# Patient Record
Sex: Female | Born: 1950
Health system: Southern US, Community
[De-identification: ages and names within clinical notes are randomized; demographics above are authoritative.]

## PROBLEM LIST (undated history)

## (undated) DIAGNOSIS — R5382 Chronic fatigue, unspecified: Secondary | ICD-10-CM

## (undated) DIAGNOSIS — I10 Essential (primary) hypertension: Secondary | ICD-10-CM

## (undated) DIAGNOSIS — M3500 Sicca syndrome, unspecified: Secondary | ICD-10-CM

## (undated) DIAGNOSIS — K219 Gastro-esophageal reflux disease without esophagitis: Secondary | ICD-10-CM

## (undated) DIAGNOSIS — A048 Other specified bacterial intestinal infections: Secondary | ICD-10-CM

## (undated) DIAGNOSIS — M6281 Muscle weakness (generalized): Secondary | ICD-10-CM

## (undated) HISTORY — DX: Sjogren syndrome, unspecified: M35.00

## (undated) HISTORY — DX: Other specified bacterial intestinal infections: A04.8

## (undated) HISTORY — DX: Muscle weakness (generalized): M62.81

## (undated) HISTORY — PX: TOE SURGERY: SHX1073

## (undated) HISTORY — DX: Essential (primary) hypertension: I10

## (undated) HISTORY — DX: Chronic fatigue, unspecified: R53.82

---

## 2001-03-13 ENCOUNTER — Encounter: Payer: Self-pay | Admitting: Internal Medicine

## 2001-03-13 ENCOUNTER — Ambulatory Visit (HOSPITAL_COMMUNITY): Admission: RE | Admit: 2001-03-13 | Discharge: 2001-03-13 | Payer: Self-pay | Admitting: Internal Medicine

## 2001-03-27 ENCOUNTER — Encounter: Payer: Self-pay | Admitting: Internal Medicine

## 2001-03-27 ENCOUNTER — Ambulatory Visit (HOSPITAL_COMMUNITY): Admission: RE | Admit: 2001-03-27 | Discharge: 2001-03-27 | Payer: Self-pay | Admitting: Internal Medicine

## 2001-04-11 ENCOUNTER — Ambulatory Visit (HOSPITAL_COMMUNITY): Admission: RE | Admit: 2001-04-11 | Discharge: 2001-04-11 | Payer: Self-pay | Admitting: Internal Medicine

## 2001-04-11 ENCOUNTER — Encounter: Payer: Self-pay | Admitting: Internal Medicine

## 2002-05-09 ENCOUNTER — Encounter (HOSPITAL_COMMUNITY): Admission: RE | Admit: 2002-05-09 | Discharge: 2002-06-08 | Payer: Self-pay | Admitting: Internal Medicine

## 2002-05-12 ENCOUNTER — Encounter: Payer: Self-pay | Admitting: Internal Medicine

## 2003-08-10 ENCOUNTER — Ambulatory Visit (HOSPITAL_COMMUNITY): Admission: RE | Admit: 2003-08-10 | Discharge: 2003-08-10 | Payer: Self-pay | Admitting: Internal Medicine

## 2003-08-10 HISTORY — PX: COLONOSCOPY: SHX174

## 2003-08-10 HISTORY — PX: ESOPHAGOGASTRODUODENOSCOPY: SHX1529

## 2005-03-06 ENCOUNTER — Other Ambulatory Visit: Admission: RE | Admit: 2005-03-06 | Discharge: 2005-03-06 | Payer: Self-pay | Admitting: Obstetrics and Gynecology

## 2005-03-23 ENCOUNTER — Encounter: Admission: RE | Admit: 2005-03-23 | Discharge: 2005-03-23 | Payer: Self-pay | Admitting: Obstetrics and Gynecology

## 2005-04-13 ENCOUNTER — Encounter: Admission: RE | Admit: 2005-04-13 | Discharge: 2005-04-13 | Payer: Self-pay | Admitting: Obstetrics and Gynecology

## 2005-08-22 ENCOUNTER — Ambulatory Visit (HOSPITAL_COMMUNITY): Admission: RE | Admit: 2005-08-22 | Discharge: 2005-08-22 | Payer: Self-pay | Admitting: Internal Medicine

## 2005-08-22 IMAGING — US US PELVIS COMPLETE MODIFY
1 series · 13 of 25 positions shown · non-contrast
Comparison: none

CLINICAL DATA: Lower abdominal and pelvic pain.  
TRANSABDOMINAL AND TRANSVAGINAL PELVIC ULTRASOUND:
TECHNIQUE: Both transabdominal and transvaginal ultrasound examinations of the pelvis were performed including evaluation of the uterus, ovaries, adnexal regions, and pelvic cul-de-sac.

[Series 1: unknown · 0.32mm/px · 13 of 45 slices shown]
[im 1/45]
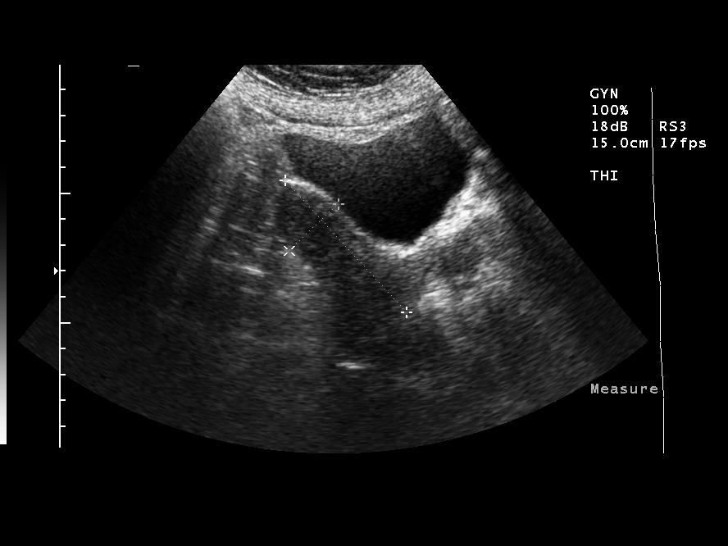
[im 4/45]
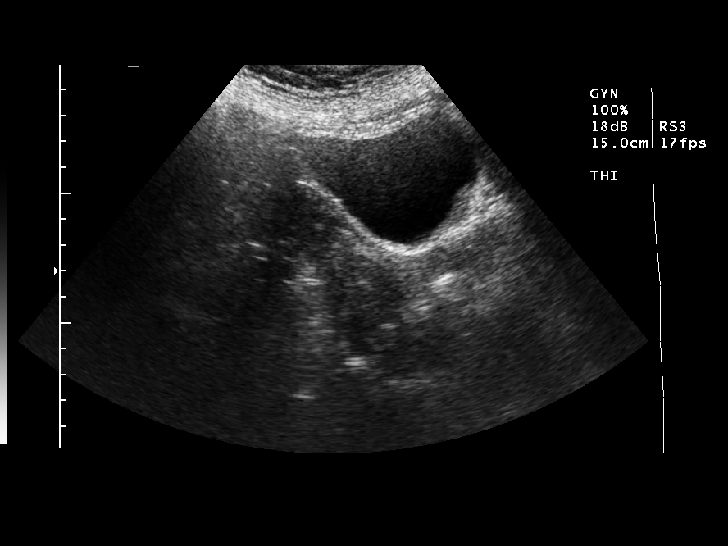
[im 8/45]
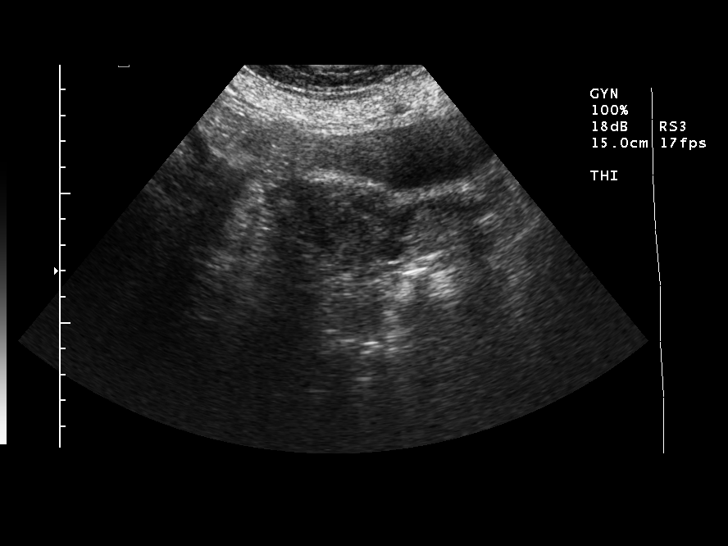
[im 12/45]
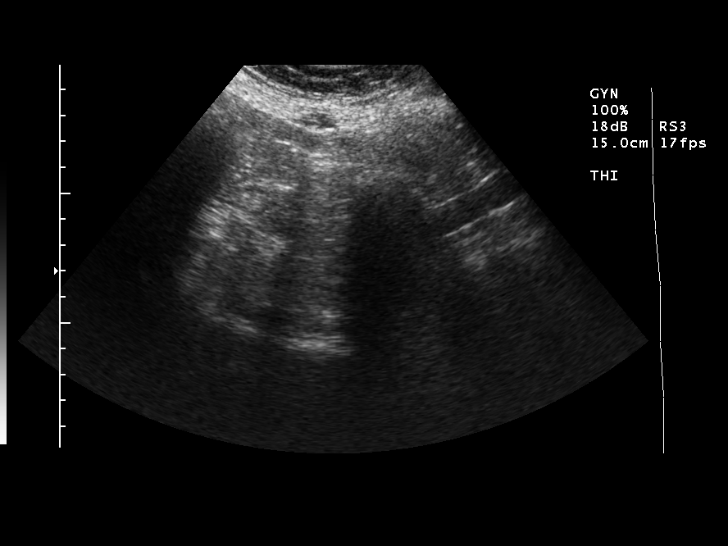
[im 15/45]
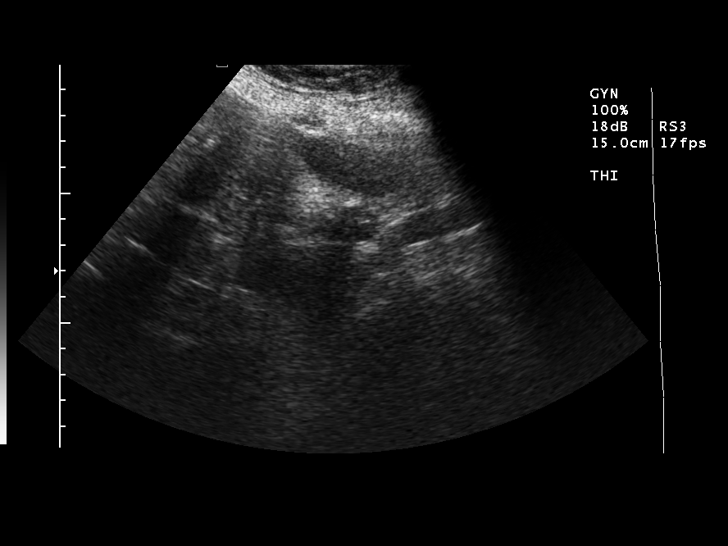
[im 19/45]
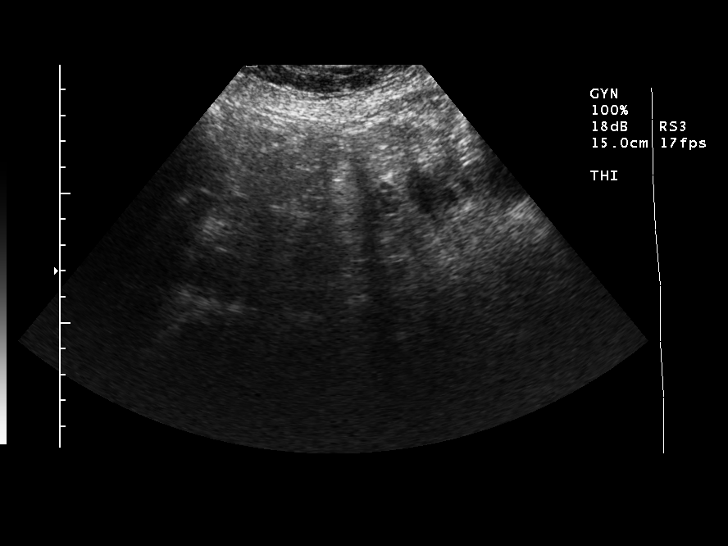
[im 23/45]
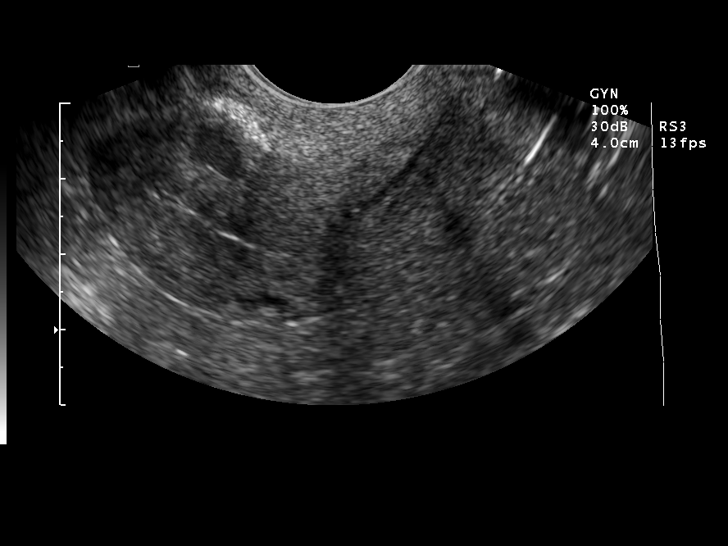
[im 26/45]
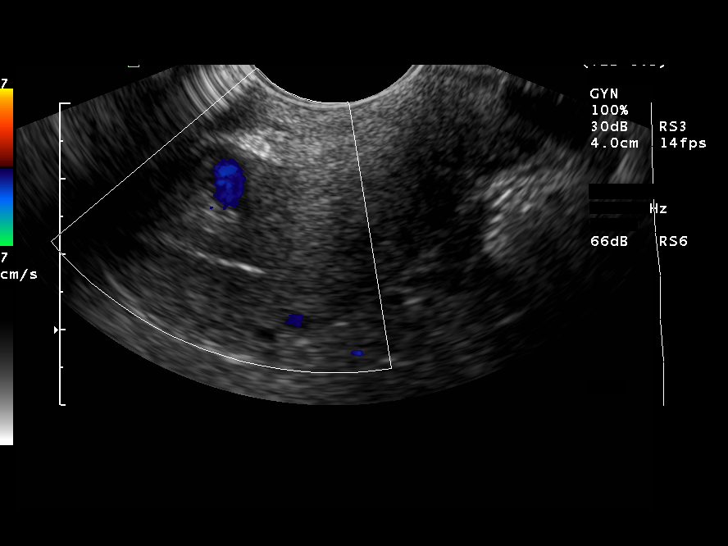
[im 30/45]
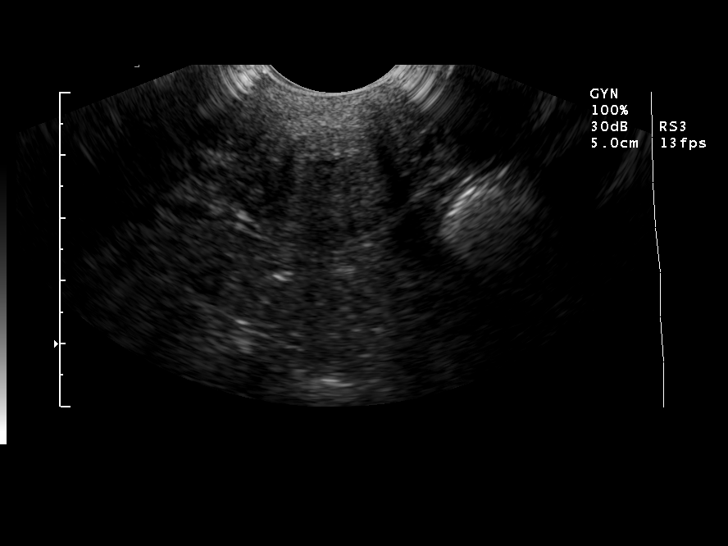
[im 34/45]
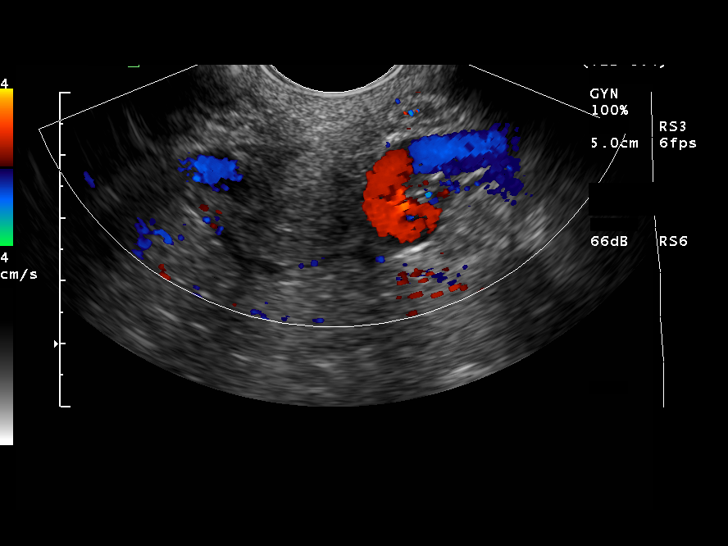
[im 37/45]
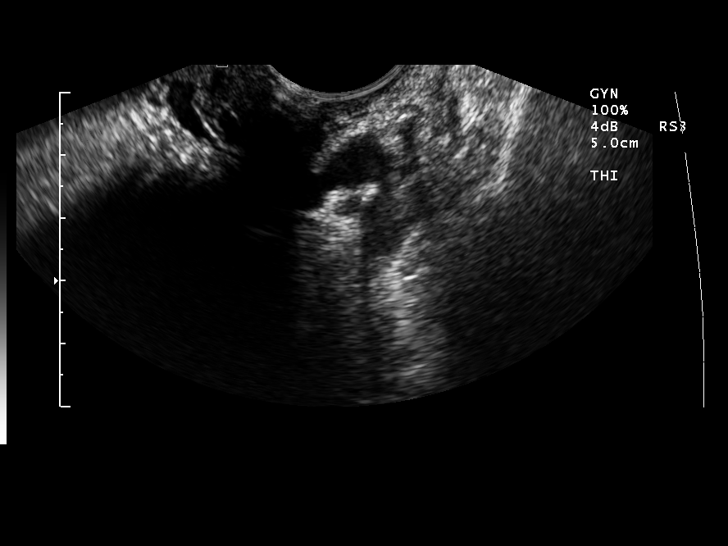
[im 41/45]
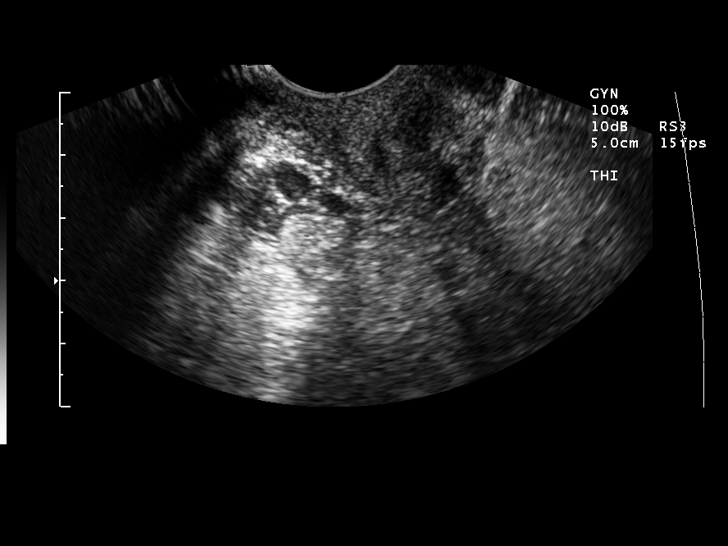
[im 45/45]
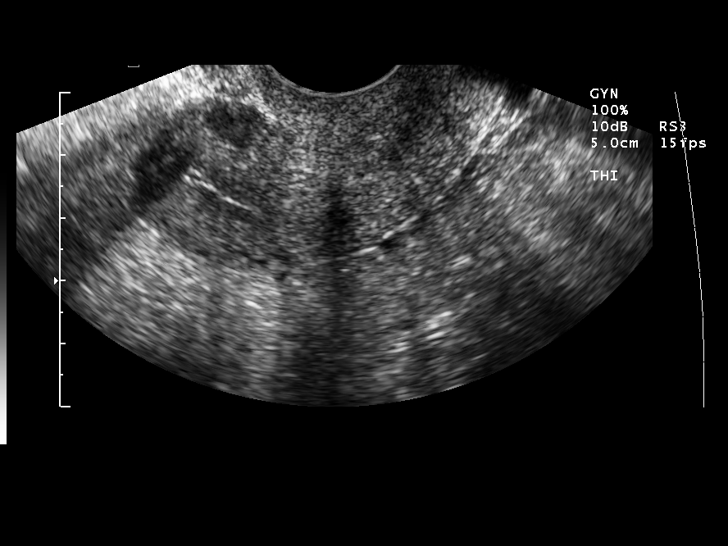

[13 of 25 positions shown; findings below may reference images not displayed]

FINDINGS: The uterus is normal in size measuring 7 cm in length.  No uterine fibroids or other masses are seen.  Prominent blood vessels are noted within the uterine myometrium and parametrial soft tissues, but no mass is identified.  Thin endometrium is seen measuring 3 mm in maximum double layer thickness on transvaginal sonography.  
Neither ovary is directly visualized on this study, however no adnexal masses are identified by either transabdominal or transvaginal sonography.  There is no evidence of free fluid.
IMPRESSION: 1.  Normal-sized uterus.  No evidence of mass or abnormal endometrial thickening.  
2.   Nonvisualization of ovaries.  No evidence of adnexal mass or free fluid identified. 
3.  Prominent vessels noted within the uterine myometrium and the parametrial soft tissues.  This is nonspecific, but may be seen with pelvic congestion syndrome; clinical correlation is recommended.

## 2005-08-22 IMAGING — CT CT ABDOMEN W/ CM
2 of 3 series · 13 of 32 positions shown, 19 images · IV contrast (CONTRAST)
Comparison: none

CLINICAL DATA: Abdominal pain.  Nausea.  Elevated liver function tests.
 ABDOMEN CT WITH CONTRAST:
TECHNIQUE: Multidetector CT imaging of the abdomen was performed following the standard protocol during bolus administration of intravenous contrast.
 Contrast:  100 cc Omnipaque 300 and oral contrast.

[Series 5971: — · axial · 0.66mm/px · z∈[+1536,+1742]mm · 11 of 51 slices shown, 17 images (1 of 2)]
[im 5/51  soft-tissue]
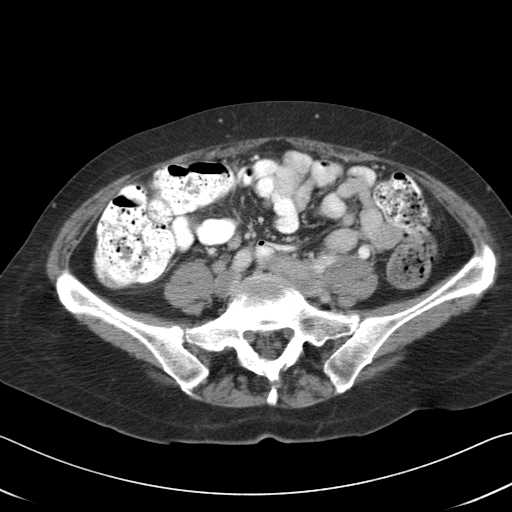
[im 5/51  bone]
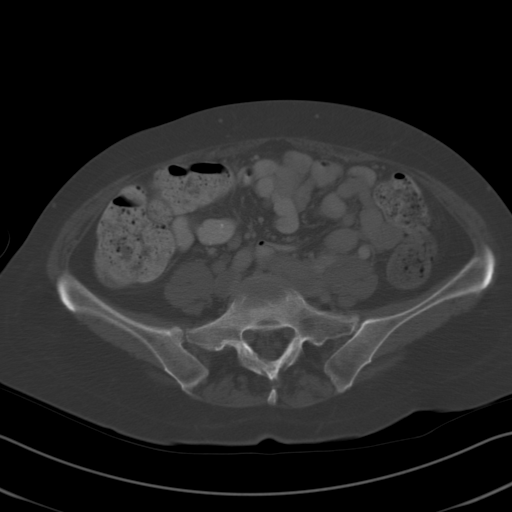
[im 9/51  soft-tissue]
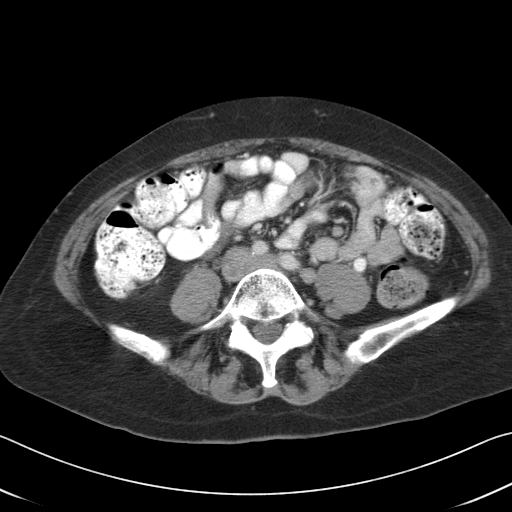
[im 13/51  soft-tissue]
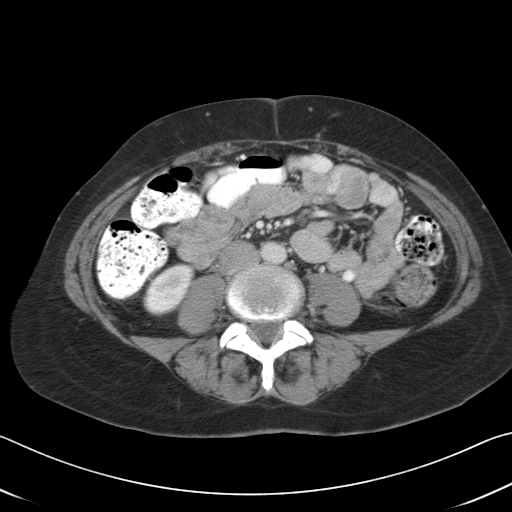
[im 17/51  soft-tissue]
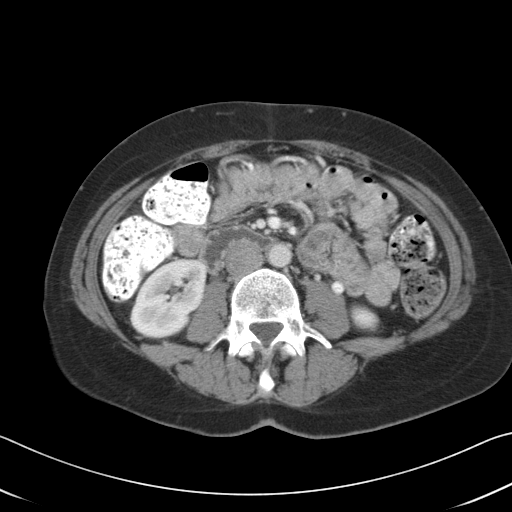
[im 21/51  soft-tissue]
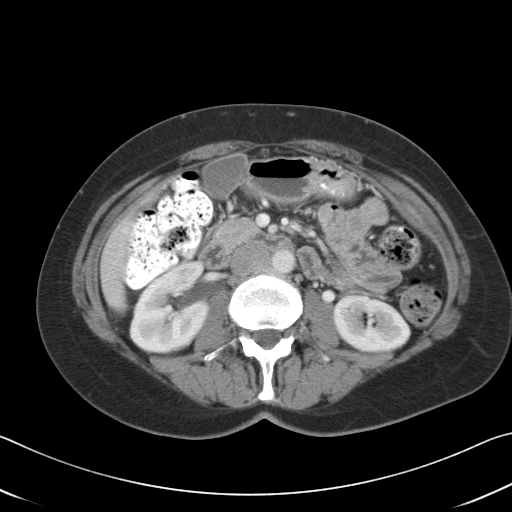
[im 26/51  soft-tissue]
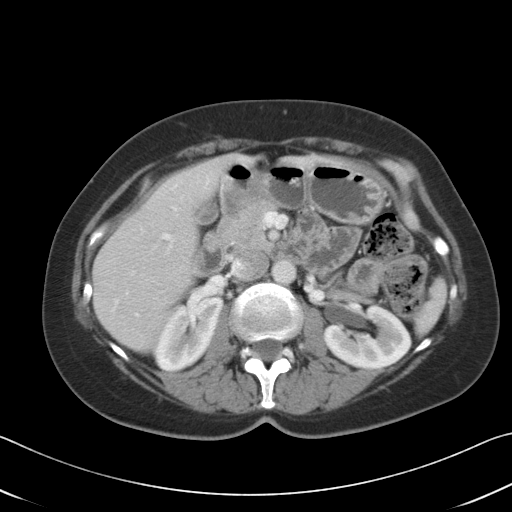
[im 30/51  soft-tissue]
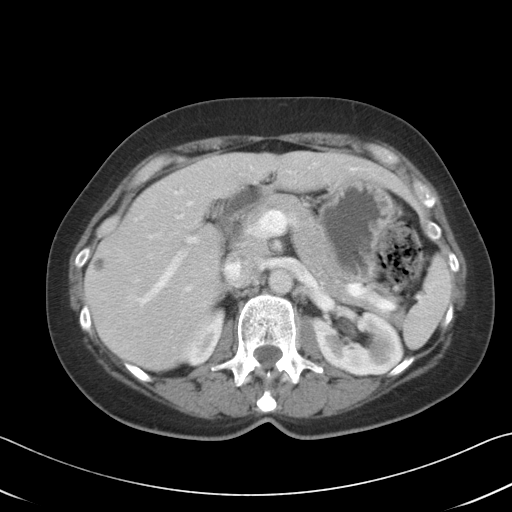
[im 34/51  soft-tissue]
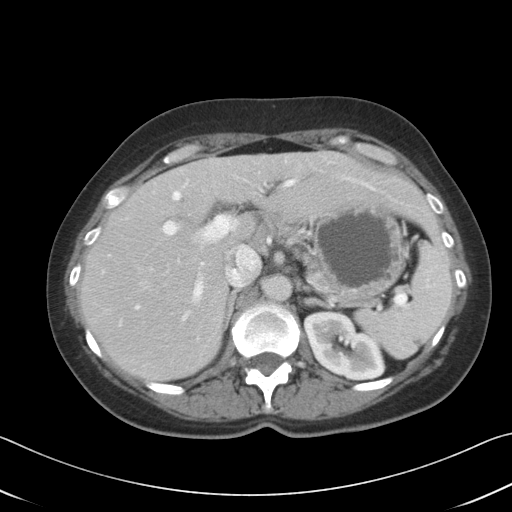
[im 34/51  lung]
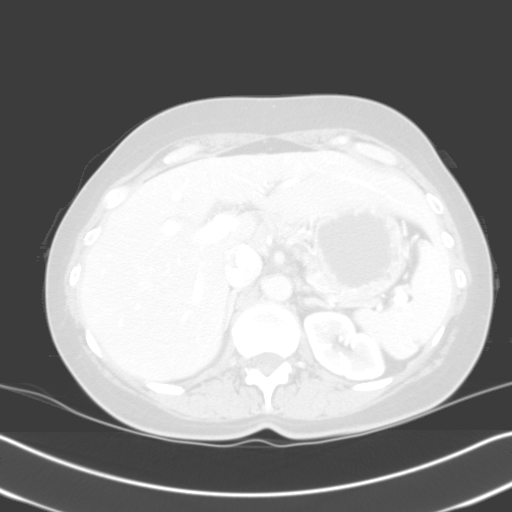
[im 38/51  soft-tissue]
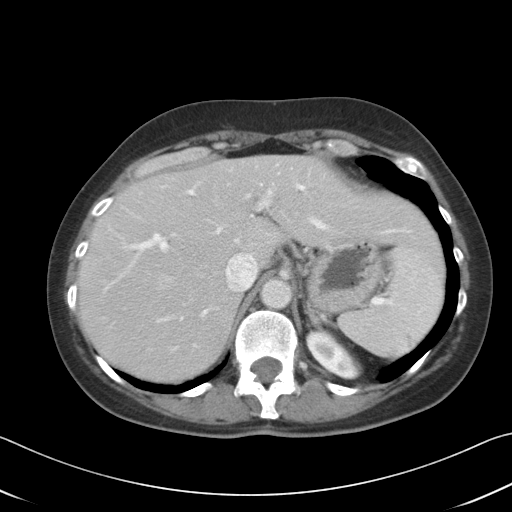
[im 38/51  lung]
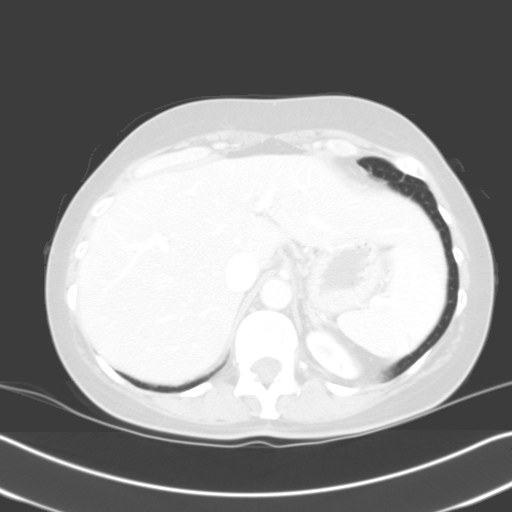
[im 38/51  bone]
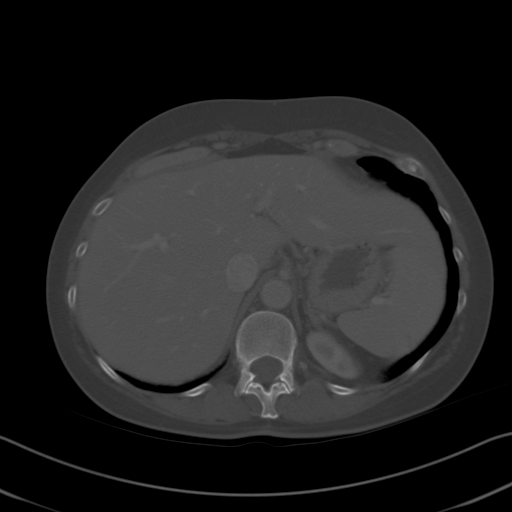
[im 42/51  soft-tissue]
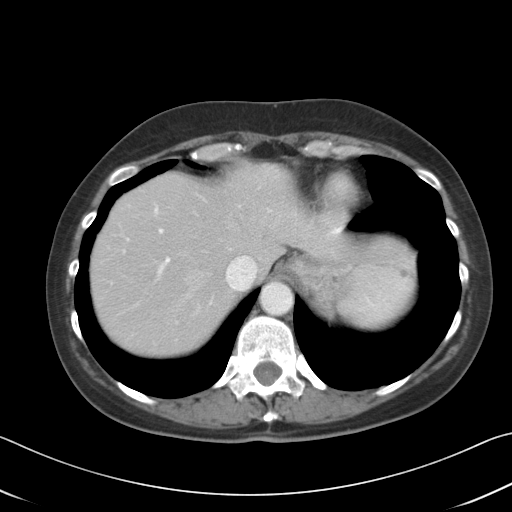
[im 42/51  lung]
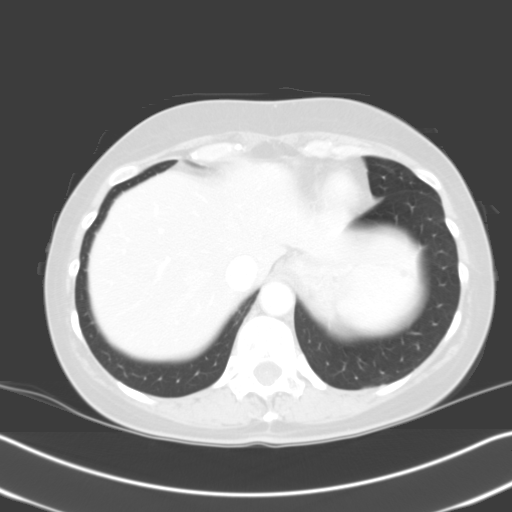
[im 46/51  soft-tissue]
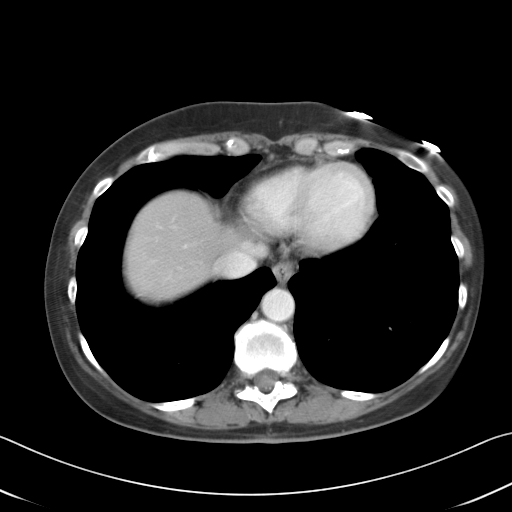
[im 46/51  lung]
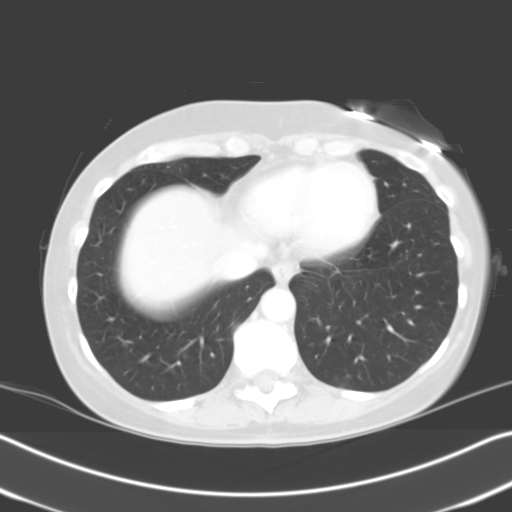

[Series 5973: — · axial · 0.66mm/px · z∈[+1666,+1692]mm · 2 of 25 slices shown (2 of 2)]
[im 5/25  soft-tissue]
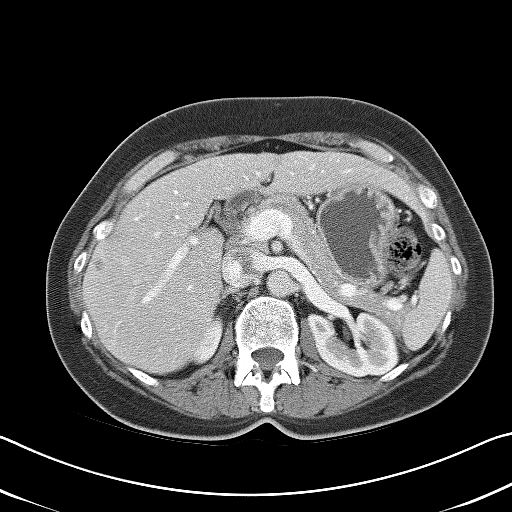
[im 10/25  soft-tissue]
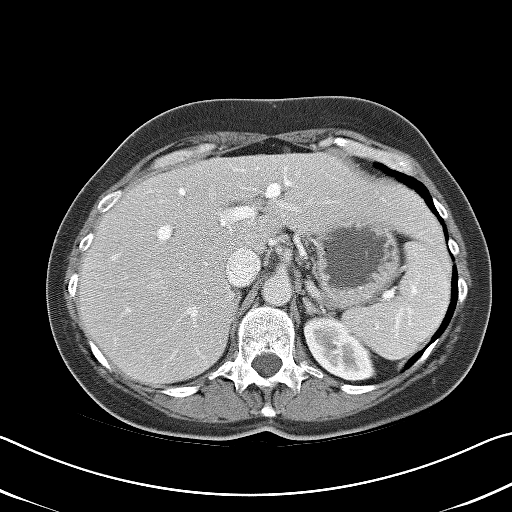

[13 of 32 positions shown; findings below may reference images not displayed]

FINDINGS: The visualized of the lung bases are clear.  Several tiny hepatic cysts are noted.  There is no evidence of liver masses or biliary dilatation.  The gallbladder is unremarkable.  The spleen, pancreas, adrenal glands, and kidneys are also normal on appearance.  There is no evidence of abnormal soft tissue mass within the abdomen.  There is no evidence of adenopathy.  There is no evidence of inflammatory process or abnormal fluid collection.  The visualized abdominal bowel loops are unremarkable.  There is no evidence of abdominal hernia or mass.
IMPRESSION: 1.  No acute findings or other significant abnormality.  
 2.  Tiny hepatic cysts incidentally noted.

## 2005-08-22 IMAGING — CR DG CHEST 2V
2 series · 2 of 2 positions shown · non-contrast
Comparison: none

HISTORY: Fatigue, cough

CHEST 2 VIEWS:
No prior study for comparison.
Normal heart size, mediastinal contours, and vascularity.
Minimal bronchitic changes
No infiltrate, effusion, or pneumothorax.
Bones unremarkable.

[view not recorded (1 of 2)]
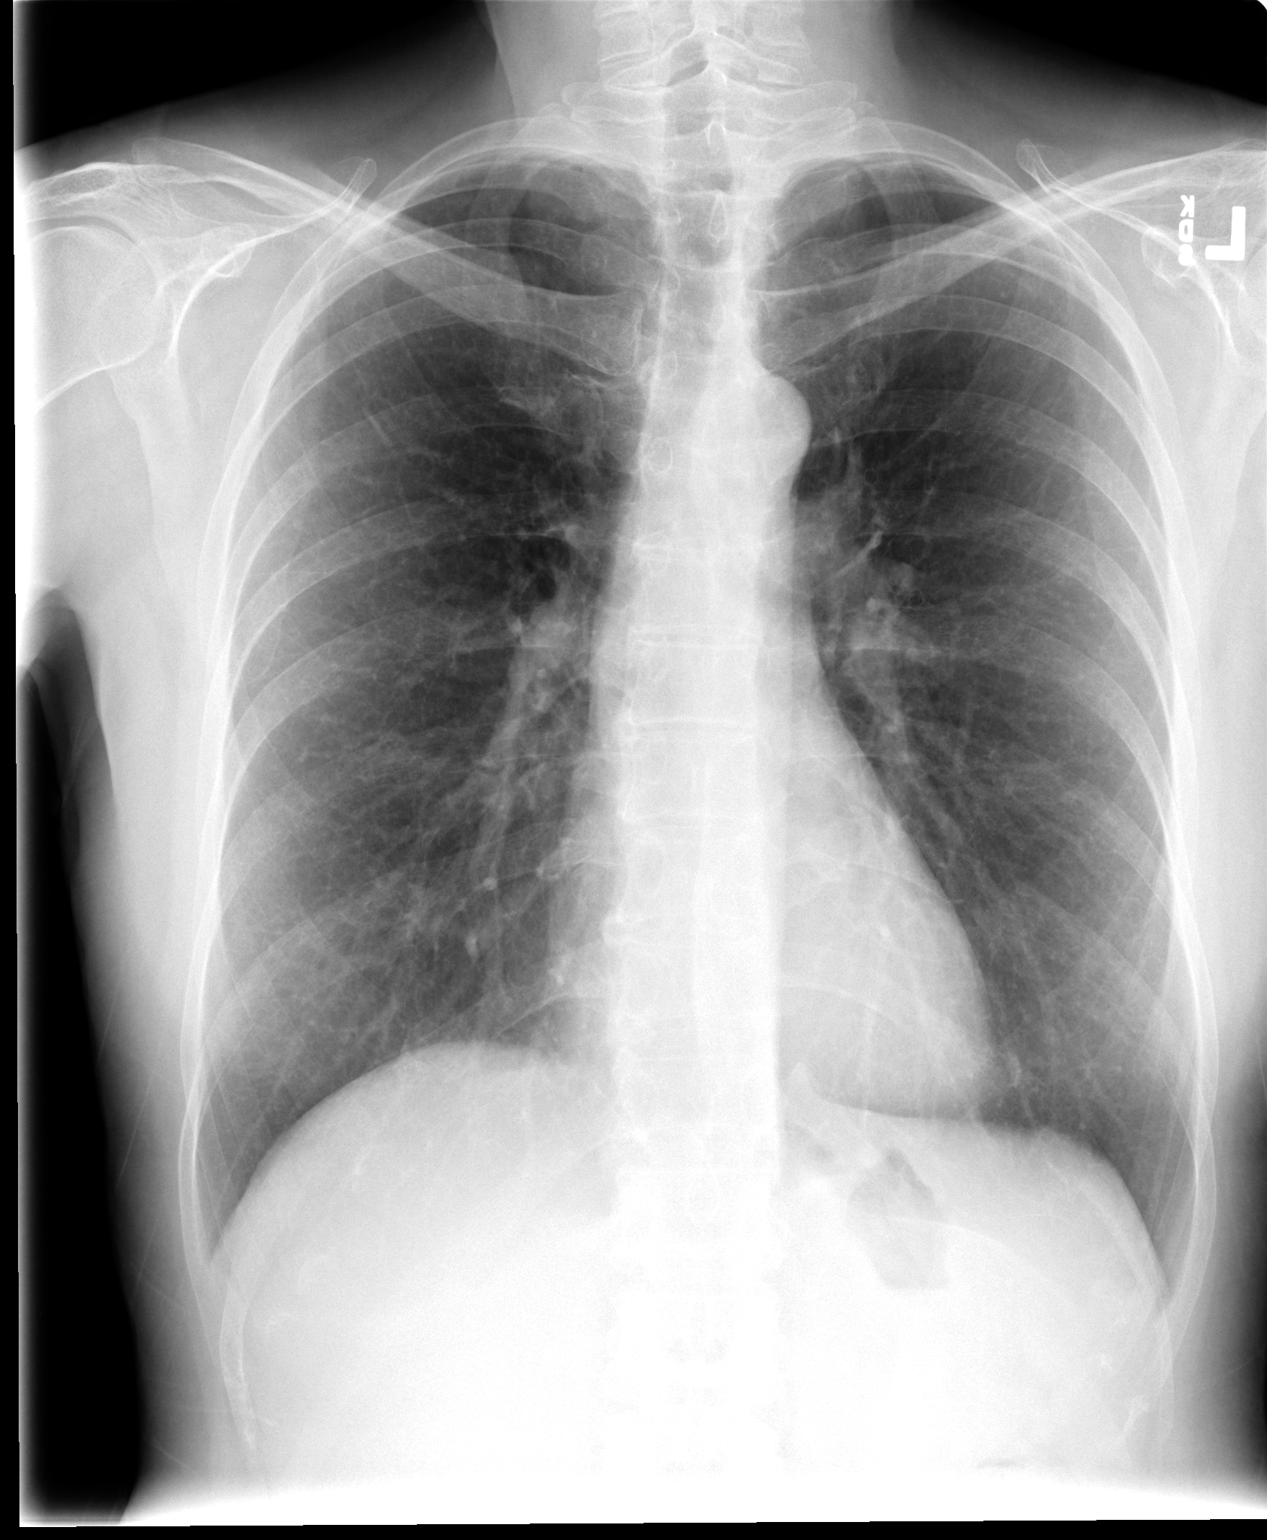

[view not recorded (2 of 2)]
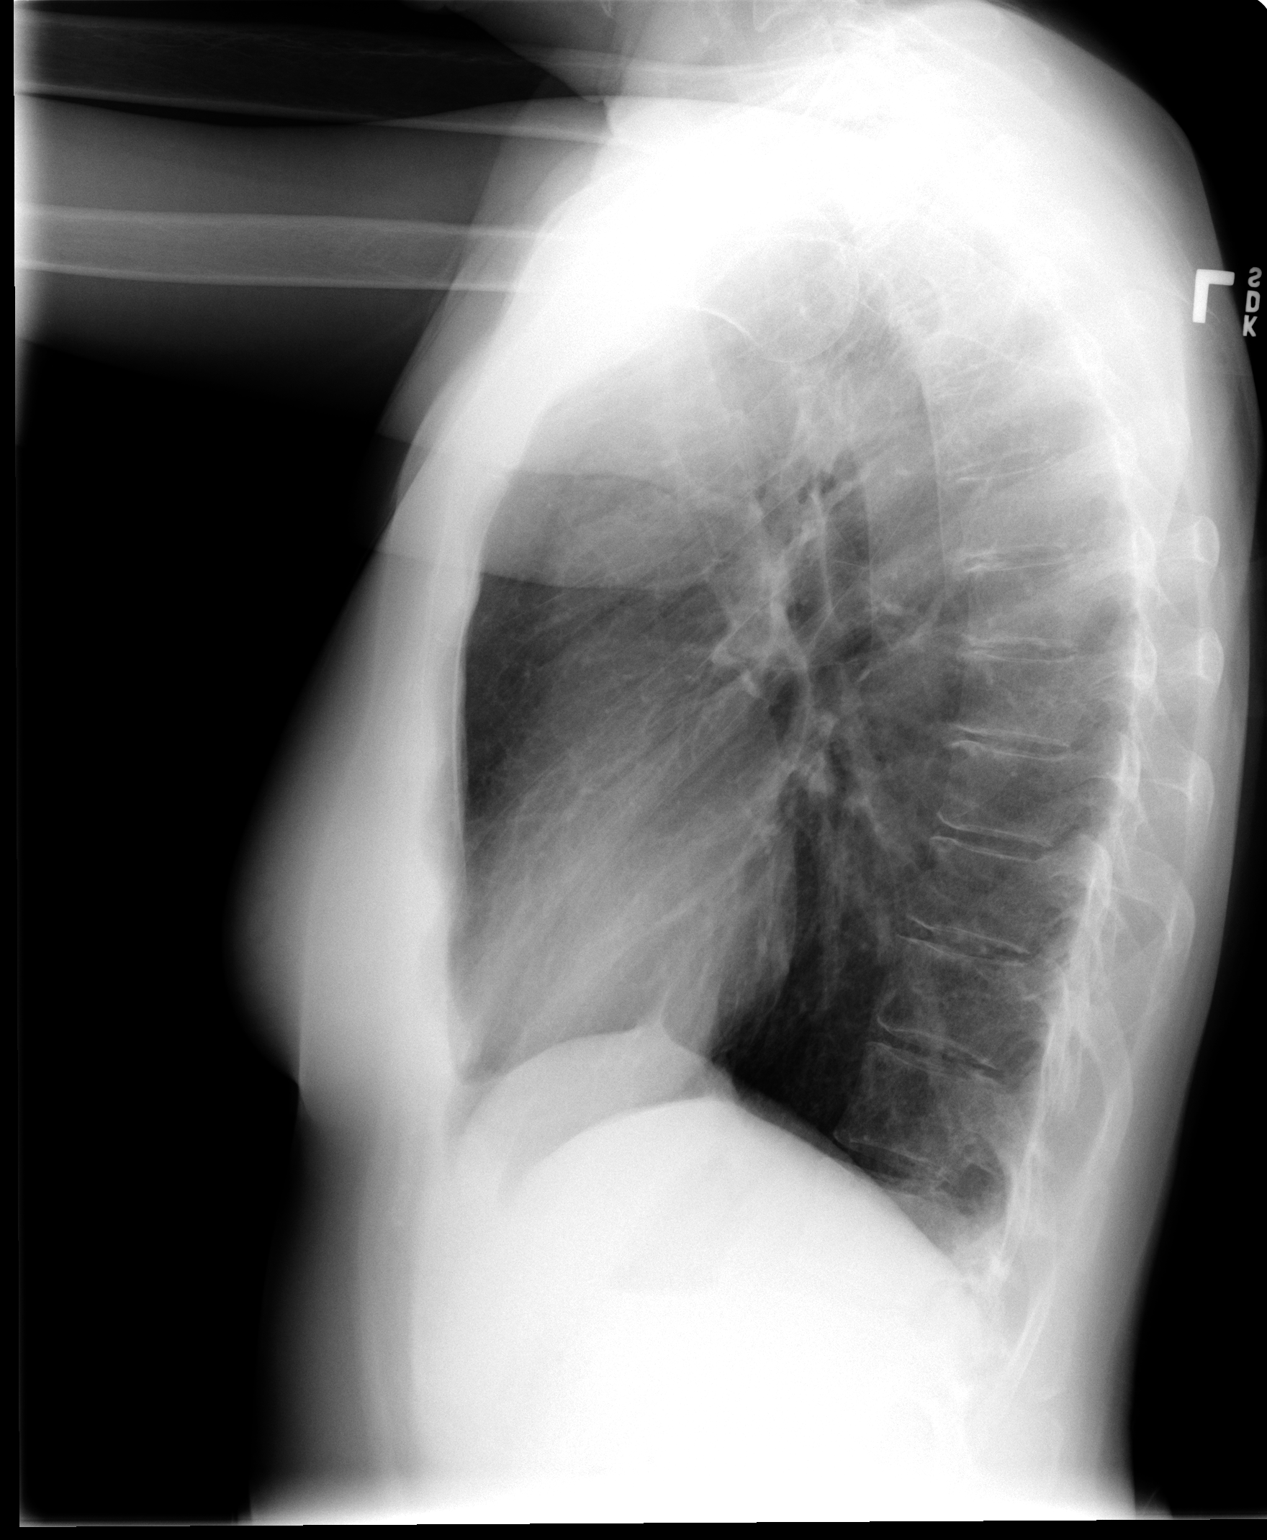

[2 of 2 positions shown; findings below may reference images not displayed]

IMPRESSION: Minimal bronchitic changes

## 2006-04-05 ENCOUNTER — Other Ambulatory Visit: Admission: RE | Admit: 2006-04-05 | Discharge: 2006-04-05 | Payer: Self-pay | Admitting: Obstetrics and Gynecology

## 2006-04-20 ENCOUNTER — Encounter: Admission: RE | Admit: 2006-04-20 | Discharge: 2006-04-20 | Payer: Self-pay | Admitting: Obstetrics and Gynecology

## 2007-04-24 ENCOUNTER — Encounter: Admission: RE | Admit: 2007-04-24 | Discharge: 2007-04-24 | Payer: Self-pay | Admitting: Obstetrics and Gynecology

## 2007-04-25 ENCOUNTER — Other Ambulatory Visit: Admission: RE | Admit: 2007-04-25 | Discharge: 2007-04-25 | Payer: Self-pay | Admitting: Obstetrics and Gynecology

## 2008-04-27 ENCOUNTER — Other Ambulatory Visit: Admission: RE | Admit: 2008-04-27 | Discharge: 2008-04-27 | Payer: Self-pay | Admitting: Obstetrics and Gynecology

## 2008-04-28 ENCOUNTER — Encounter: Admission: RE | Admit: 2008-04-28 | Discharge: 2008-04-28 | Payer: Self-pay | Admitting: Obstetrics and Gynecology

## 2008-04-30 ENCOUNTER — Encounter: Admission: RE | Admit: 2008-04-30 | Discharge: 2008-04-30 | Payer: Self-pay | Admitting: Obstetrics and Gynecology

## 2009-04-29 ENCOUNTER — Ambulatory Visit (HOSPITAL_COMMUNITY): Admission: RE | Admit: 2009-04-29 | Discharge: 2009-04-29 | Payer: Self-pay | Admitting: Obstetrics and Gynecology

## 2010-03-18 ENCOUNTER — Other Ambulatory Visit: Payer: Self-pay | Admitting: Obstetrics and Gynecology

## 2010-03-18 DIAGNOSIS — Z1239 Encounter for other screening for malignant neoplasm of breast: Secondary | ICD-10-CM

## 2010-05-02 ENCOUNTER — Ambulatory Visit (HOSPITAL_COMMUNITY): Payer: Self-pay

## 2010-05-02 ENCOUNTER — Ambulatory Visit (HOSPITAL_COMMUNITY)
Admission: RE | Admit: 2010-05-02 | Discharge: 2010-05-02 | Disposition: A | Discharge status: Home / Self Care | Payer: BC Managed Care – PPO | Source: Ambulatory Visit | Attending: Obstetrics and Gynecology | Admitting: Obstetrics and Gynecology

## 2010-05-02 DIAGNOSIS — Z1239 Encounter for other screening for malignant neoplasm of breast: Secondary | ICD-10-CM

## 2010-05-02 DIAGNOSIS — Z1231 Encounter for screening mammogram for malignant neoplasm of breast: Secondary | ICD-10-CM | POA: Insufficient documentation

## 2010-07-15 NOTE — Procedures (Signed)
   NAME:  Karen Giles, Karen Giles NO.:  [id number]   MEDICAL RECORD NO.:  [id number]                  PATIENT TYPE:   LOCATION:                                       FACILITY:   PHYSICIAN:  Kingsley Callander. Ouida Sills, M.D.                  DATE OF BIRTH:   DATE OF PROCEDURE:  05/09/2002  DATE OF DISCHARGE:                                    STRESS TEST   DESCRIPTION OF PROCEDURE:  The patient exercised 8 minutes, 33 seconds (2  minutes, 33 seconds into stage III of Bruce protocol) attaining a maximal  heart rate of 164 (98% of the age-predicted maximal heart rate) at a work  load of 10.1 METS and discontinued exercise due to fatigue.  There were no  symptoms of chest pain.  There were no arrhythmias.  There were no ST  segment changes diagnostic of ischemia.  The baseline electrocardiogram  revealed normal sinus rhythm at 84 beats per minute.   IMPRESSION:  No evidence of exercise-induced ischemia.  Cardiolite images  pending.                                               Kingsley Callander. Ouida Sills, M.D.    ROF/MEDQ  D:  05/09/2002  T:  05/10/2002  Job:  [id number]

## 2010-07-15 NOTE — Op Note (Signed)
NAME:  Karen Giles, Karen Giles                        ACCOUNT NO.:  [id number]   MEDICAL RECORD NO.:  [id number]                   PATIENT TYPE:  AMB   LOCATION:  DAY                                  FACILITY:  [facility]   PHYSICIAN:  R. Roetta Sessions, M.D.              DATE OF BIRTH:  Nov 15, 1950   DATE OF PROCEDURE:  08/10/2003  DATE OF DISCHARGE:                                 OPERATIVE REPORT   PROCEDURE:  EGD with Karen Giles dilation followed by screening colonoscopy.   INDICATIONS FOR PROCEDURE:  The patient is a 60 year old lady with recent  prominent gastroesophageal reflux disease symptoms and esophageal dysphagia  in the cervical region.  She was seen in the office back on 06/30/2003.  She  was slated for a screening colonoscopy and EGD; however, she put these  procedures off because of vacation.  She returns telling me that her reflux  symptoms have really settled down by watching what she eats.  She is not  having any bowel symptoms.  No prior colonoscopy.  No family history of  colorectal neoplasia.  She is not taking any acid suppression therapy at  this time.  She continues to have intermittent esophageal dysphagia.  EGD  and colonoscopy are now being done.  Please see my dictated H&P and  handwritten update.   PROCEDURE:  O2 saturation, blood pressure, pulses, and respirations were  monitored throughout the entirety of both procedures.  Conscious sedation  was with Versed 3 mg IV, Demerol 75 mg IV in divided doses.  The instrument  used was the Olympus video chip system.   EGD FINDINGS:  Examination of the tubular esophagus revealed the possibility  of a subtle cervical web.  The esophageal mucosa otherwise appeared normal.  The EG junction was easily traversed.   Stomach:  The gastric cavity was empty and insufflated well with air.  Thorough examination of the gastric mucosa including retroflex view in the  proximal stomach and esophagogastric junction demonstrated no  abnormalities.  The pylorus was patent and easily traversed.   Duodenum:  Examination of the bulb and second portion revealed no  abnormalities.   THERAPEUTIC/DIAGNOSTIC MANEUVERS PERFORMED:  A 54 French Maloney dilator was  passed to full insertion.  A look back revealed a small tear at the UES.  Otherwise, no apparent complications related to passage of the dilator.   The patient tolerated the procedure well and was prepared for colonoscopy.   COLONOSCOPY FINDINGS:  Digital rectal examination revealed no abnormalities.   ENDOSCOPIC FINDINGS:  The prep was good.   Rectum:  Examination of the rectal mucosa including retroflex view of the  anal verge revealed no abnormalities.   Colon:  The colonic mucosa was surveyed from the rectosigmoid junction  through the left, transverse, right colon to the area of the appendiceal  orifice, ileocecal valve, and cecum.  These structures were well-seen and  photographed for the record.  From this level, the scope was slowly  withdrawn.  All previously mentioned mucosal surfaces were again seen.  The  colonic mucosa appeared normal.  The patient tolerated both procedures well  and was reactive in endoscopy.   EGD IMPRESSION:  Possible cervical esophageal web status post dilation.  Otherwise normal esophagus, stomach, and first and second portions of the  duodenum.   COLONOSCOPY IMPRESSION:  1. Normal rectum.  2. Normal colon.   RECOMMENDATIONS:  1. Antireflux literature provided to Karen Giles.  2. Repeat colonoscopy in 10 years.  3. The patient is to follow up with Dr. Ouida Sills as needed.      ___________________________________________                                            Jonathon Bellows, M.D.   RMR/MEDQ  D:  08/10/2003  T:  08/10/2003  Job:  [id number]   cc:   Kingsley Callander. Ouida Sills, M.D.  [address]  [place]  [place] [address]  Fax: [phone]

## 2011-04-17 ENCOUNTER — Other Ambulatory Visit: Payer: Self-pay | Admitting: Obstetrics and Gynecology

## 2011-04-17 ENCOUNTER — Other Ambulatory Visit: Payer: Self-pay | Admitting: Obstetrics & Gynecology

## 2011-04-17 DIAGNOSIS — Z1231 Encounter for screening mammogram for malignant neoplasm of breast: Secondary | ICD-10-CM

## 2011-05-09 ENCOUNTER — Ambulatory Visit
Admission: RE | Admit: 2011-05-09 | Discharge: 2011-05-09 | Disposition: A | Discharge status: Home / Self Care | Payer: BC Managed Care – PPO | Source: Ambulatory Visit | Attending: Obstetrics & Gynecology | Admitting: Obstetrics & Gynecology

## 2011-05-09 DIAGNOSIS — Z1231 Encounter for screening mammogram for malignant neoplasm of breast: Secondary | ICD-10-CM

## 2012-01-30 ENCOUNTER — Encounter: Payer: Self-pay | Admitting: *Deleted

## 2012-02-28 DIAGNOSIS — A048 Other specified bacterial intestinal infections: Secondary | ICD-10-CM

## 2012-02-28 HISTORY — DX: Other specified bacterial intestinal infections: A04.8

## 2012-02-29 ENCOUNTER — Encounter: Payer: Self-pay | Admitting: Internal Medicine

## 2012-03-04 ENCOUNTER — Ambulatory Visit (INDEPENDENT_AMBULATORY_CARE_PROVIDER_SITE_OTHER): Payer: BC Managed Care – PPO | Admitting: Urgent Care

## 2012-03-04 ENCOUNTER — Encounter: Payer: Self-pay | Admitting: Urgent Care

## 2012-03-04 VITALS — BP 146/77 | HR 67 | Temp 98.2°F | Ht 67.0 in | Wt 141.0 lb

## 2012-03-04 DIAGNOSIS — R1310 Dysphagia, unspecified: Secondary | ICD-10-CM | POA: Insufficient documentation

## 2012-03-04 DIAGNOSIS — K219 Gastro-esophageal reflux disease without esophagitis: Secondary | ICD-10-CM

## 2012-03-04 MED ORDER — ESOMEPRAZOLE MAGNESIUM 40 MG PO CPDR: 40.0000 mg | DELAYED_RELEASE_CAPSULE | Freq: Every day | ORAL | Status: DC

## 2012-03-04 NOTE — Patient Instructions (Addendum)
Nexium 40 mg daily EGD (upper endoscopy) with Dr Jena Gauss He may stretch your esophagus at the same time June 2015 next screening colonoscopy

## 2012-03-04 NOTE — Assessment & Plan Note (Signed)
Karen Giles is a pleasant 62 y.o. female with solid food dysphagia.  She has hx of cervical esophageal web s/p dilation with 56F Maloney in 2005.  She has poorly controlled GERD & is not on any treatment. Will need EGD with possible esophageal dilation with Dr. Jena Gauss to look for recurrent esophageal web, ring or stricture.  I have discussed risks & benefits which include, but are not limited to, bleeding, infection, perforation & drug reaction.  The patient agrees with this plan & written consent will be obtained.

## 2012-03-04 NOTE — Assessment & Plan Note (Signed)
Daily heartburn, worse nocturnally.  Standard GERD precautions.  Begin PPI.  EGD as planned.  She does not want to commit to daily PPI unless necessary.  Will reevaluate symptom control at time of EGD.  Begin Nexium 40 mg daily June 2015 next screening colonoscopy

## 2012-03-04 NOTE — Progress Notes (Signed)
Faxed to PCP

## 2012-03-04 NOTE — Progress Notes (Signed)
Referring Provider: Fagan, Roy, MD Primary Care Physician:  FAGAN,ROY, MD Primary Gastroenterologist:  Dr. Rourk  Chief Complaint  Patient presents with  . EGD    dysphagia    HPI:  Karen Giles is a 61 y.o. female here as a referral from Dr. Fagan for esophageal dysphagia.  She has hx of cervical esophageal web dilated with 54F Maloney 08/10/2003 by Dr Rourk.  She has been having reflux & it seems to be getting worse.  She c/o burning in her chest.  Solid foods seem to "drag down" her esopahgus.  Solid foods seem to get stuck in upper chest.  Denies any problems with liquids or pills.  Most problems are with dry foods or meat.  She reports having to flush her esophagus with liquids after eating.  She even has heartburn after eating bland foods.  Her symptoms are worse every night.  She has not been on PPI or OTC medications. Denies nausea or vomiting.  Weight loss 6-8# by exercising & cutting back last year.  She has a BM daily without rectal bleeding or melena.  Occasional early morning lower abdominal pain she describes as a discomfort awakens her from sleeping.  Once she has a BM, she is better.   Past Medical History  Diagnosis Date  . HTN (hypertension)     Past Surgical History  Procedure Date  . Esophagogastroduodenoscopy   08/10/2003    RMR: Possible cervical esophageal web status post dilation/ Otherwise normal esophagus, stomach, and first and second portions of the duodenum  . Colonoscopy   08/10/2003    RMR: Normal rectum and colon    Current Outpatient Prescriptions  Medication Sig Dispense Refill  . chlorthalidone (HYGROTON) 25 MG tablet Take 12.5 mg by mouth daily.       . ESTRING 2 MG vaginal ring 1 applicator Every 3 months.      . esomeprazole (NEXIUM) 40 MG capsule Take 1 capsule (40 mg total) by mouth daily before breakfast.  30 capsule  2  . esomeprazole (NEXIUM) 40 MG capsule Take 1 capsule (40 mg total) by mouth daily before breakfast.  15 capsule  0     Allergies as of 03/04/2012 - Review Complete 03/04/2012  Allergen Reaction Noted  . Penicillins Rash 03/04/2012   Family History:  There is no known family history of colorectal carcinoma , liver disease, or inflammatory bowel disease.  History   Social History  . Marital Status: Married    Spouse Name: N/A    Number of Children: 1  . Years of Education: N/A   Occupational History  . retired, teacher/administrator of elementary school    Social History Main Topics  . Smoking status: Never Smoker   . Smokeless tobacco: Not on file  . Alcohol Use: Yes     Comment: socailly  . Drug Use: No  . Sexually Active: Not on file   Other Topics Concern  . Not on file   Social History Narrative   LIves w/ husband    Review of Systems: Gen: Denies any fever, chills, sweats, anorexia, fatigue, weakness, malaise, weight loss, and sleep disorder CV: Denies chest pain, angina, palpitations, syncope, orthopnea, PND, peripheral edema, and claudication. Resp: Denies dyspnea at rest, dyspnea with exercise, cough, sputum, wheezing, coughing up blood, and pleurisy. GI: Denies vomiting blood, jaundice, and fecal incontinence.   GU : Denies urinary burning, blood in urine, urinary frequency, urinary hesitancy, nocturnal urination, and urinary incontinence. MS: Denies joint pain, limitation of   movement, and swelling, stiffness, low back pain, extremity pain. Denies muscle weakness, cramps, atrophy.  Derm: Denies rash, itching, dry skin, hives, moles, warts, or unhealing ulcers.  Psych: Denies depression, anxiety, memory loss, suicidal ideation, hallucinations, paranoia, and confusion. Heme: Denies bruising, bleeding, and enlarged lymph nodes. Neuro:  Denies any headaches, dizziness, paresthesias. Endo:  Denies any problems with DM, thyroid, adrenal function.  Physical Exam: BP 146/77  Pulse 67  Temp 98.2 F (36.8 C) (Oral)  Ht 5' 7" (1.702 m)  Wt 141 lb (63.957 kg)  BMI 22.08 kg/m2 No  LMP recorded. Patient is postmenopausal. General:   Alert,  Well-developed, well-nourished, pleasant and cooperative in NAD Head:  Normocephalic and atraumatic. Eyes:  Sclera clear, no icterus.   Conjunctiva pink. Ears:  Normal auditory acuity. Nose:  No deformity, discharge, or lesions. Mouth:  No deformity or lesions,oropharynx pink & moist. Neck:  Supple; no masses or thyromegaly. Lungs:  Clear throughout to auscultation.   No wheezes, crackles, or rhonchi. No acute distress. Heart:  Regular rate and rhythm; no murmurs, clicks, rubs,  or gallops. Abdomen:  Normal bowel sounds.  No bruits.  Soft, non-tender and non-distended without masses, hepatosplenomegaly or hernias noted.  No guarding or rebound tenderness.   Rectal:  Deferred.  Msk:  Symmetrical without gross deformities. Normal posture. Pulses:  Normal pulses noted. Extremities:  No clubbing or edema. Neurologic:  Alert and oriented x4;  grossly normal neurologically. Skin:  Intact without significant lesions or rashes. Lymph Nodes:  No significant cervical adenopathy. Psych:  Alert and cooperative. Normal mood and affect.  

## 2012-03-05 ENCOUNTER — Encounter (HOSPITAL_COMMUNITY): Payer: Self-pay | Admitting: Pharmacy Technician

## 2012-03-07 ENCOUNTER — Encounter (HOSPITAL_COMMUNITY): Payer: Self-pay | Admitting: *Deleted

## 2012-03-07 ENCOUNTER — Ambulatory Visit (HOSPITAL_COMMUNITY)
Admission: RE | Admit: 2012-03-07 | Discharge: 2012-03-07 | Disposition: A | Discharge status: Home / Self Care | Payer: BC Managed Care – PPO | Source: Ambulatory Visit | Attending: Internal Medicine | Admitting: Internal Medicine

## 2012-03-07 ENCOUNTER — Encounter (HOSPITAL_COMMUNITY)
Admission: RE | Disposition: A | Discharge status: Home / Self Care | Payer: Self-pay | Source: Ambulatory Visit | Attending: Internal Medicine

## 2012-03-07 DIAGNOSIS — R1310 Dysphagia, unspecified: Secondary | ICD-10-CM

## 2012-03-07 DIAGNOSIS — K2940 Chronic atrophic gastritis without bleeding: Secondary | ICD-10-CM | POA: Insufficient documentation

## 2012-03-07 DIAGNOSIS — A048 Other specified bacterial intestinal infections: Secondary | ICD-10-CM | POA: Insufficient documentation

## 2012-03-07 DIAGNOSIS — I10 Essential (primary) hypertension: Secondary | ICD-10-CM | POA: Insufficient documentation

## 2012-03-07 DIAGNOSIS — K2100 Gastro-esophageal reflux disease with esophagitis, without bleeding: Secondary | ICD-10-CM | POA: Insufficient documentation

## 2012-03-07 HISTORY — PX: ESOPHAGOGASTRODUODENOSCOPY (EGD) WITH ESOPHAGEAL DILATION: SHX5812

## 2012-03-07 HISTORY — DX: Gastro-esophageal reflux disease without esophagitis: K21.9

## 2012-03-07 SURGERY — ESOPHAGOGASTRODUODENOSCOPY (EGD) WITH ESOPHAGEAL DILATION
Anesthesia: Moderate Sedation

## 2012-03-07 MED ORDER — MIDAZOLAM HCL 5 MG/5ML IJ SOLN
INTRAMUSCULAR | Status: AC
  Filled 2012-03-07: qty 10

## 2012-03-07 MED ORDER — SODIUM CHLORIDE 0.45 % IV SOLN
INTRAVENOUS | Status: DC
  Administered 2012-03-07: 08:00:00 via INTRAVENOUS

## 2012-03-07 MED ORDER — MIDAZOLAM HCL 5 MG/5ML IJ SOLN
INTRAMUSCULAR | Status: DC | PRN
  Administered 2012-03-07 (×2): 1 mg via INTRAVENOUS
  Administered 2012-03-07: 2 mg via INTRAVENOUS

## 2012-03-07 MED ORDER — STERILE WATER FOR IRRIGATION IR SOLN
Status: DC | PRN
  Administered 2012-03-07: 09:00:00

## 2012-03-07 MED ORDER — MEPERIDINE HCL 100 MG/ML IJ SOLN
INTRAMUSCULAR | Status: DC | PRN
  Administered 2012-03-07: 50 mg via INTRAVENOUS
  Administered 2012-03-07: 25 mg via INTRAVENOUS

## 2012-03-07 MED ORDER — MEPERIDINE HCL 100 MG/ML IJ SOLN
INTRAMUSCULAR | Status: AC
  Filled 2012-03-07: qty 2

## 2012-03-07 MED ORDER — BUTAMBEN-TETRACAINE-BENZOCAINE 2-2-14 % EX AERO
INHALATION_SPRAY | CUTANEOUS | Status: DC | PRN
  Administered 2012-03-07: 2 via TOPICAL

## 2012-03-07 NOTE — H&P (View-Only) (Signed)
Referring Provider: Carylon Perches, MD Primary Care Physician:  Carylon Perches, MD Primary Gastroenterologist:  Dr. Jena Gauss  Chief Complaint  Patient presents with  . EGD    dysphagia    HPI:  Karen Giles is a 62 y.o. female here as a referral from Dr. Ouida Sills for esophageal dysphagia.  She has hx of cervical esophageal web dilated with 46F [name] 08/10/2003 by Dr Jena Gauss.  She has been having reflux & it seems to be getting worse.  She c/o burning in her chest.  Solid foods seem to "drag down" her esopahgus.  Solid foods seem to get stuck in upper chest.  Denies any problems with liquids or pills.  Most problems are with dry foods or meat.  She reports having to flush her esophagus with liquids after eating.  She even has heartburn after eating bland foods.  Her symptoms are worse every night.  She has not been on PPI or OTC medications. Denies nausea or vomiting.  Weight loss 6-8# by exercising & cutting back last year.  She has a BM daily without rectal bleeding or melena.  Occasional early morning lower abdominal pain she describes as a discomfort awakens her from sleeping.  Once she has a BM, she is better.   Past Medical History  Diagnosis Date  . HTN (hypertension)     Past Surgical History  Procedure Date  . Esophagogastroduodenoscopy   08/10/2003    RMR: Possible cervical esophageal web status post dilation/ Otherwise normal esophagus, stomach, and first and second portions of the duodenum  . Colonoscopy   08/10/2003    RMR: Normal rectum and colon    Current Outpatient Prescriptions  Medication Sig Dispense Refill  . chlorthalidone (HYGROTON) 25 MG tablet Take 12.5 mg by mouth daily.       Marland Kitchen ESTRING 2 MG vaginal ring 1 applicator Every 3 months.      . esomeprazole (NEXIUM) 40 MG capsule Take 1 capsule (40 mg total) by mouth daily before breakfast.  30 capsule  2  . esomeprazole (NEXIUM) 40 MG capsule Take 1 capsule (40 mg total) by mouth daily before breakfast.  15 capsule  0     Allergies as of 03/04/2012 - Review Complete 03/04/2012  Allergen Reaction Noted  . Penicillins Rash 03/04/2012   Family History:  There is no known family history of colorectal carcinoma , liver disease, or inflammatory bowel disease.  History   Social History  . Marital Status: Married    Spouse Name: N/A    Number of Children: 1  . Years of Education: N/A   Occupational History  . retired, Acupuncturist school    Social History Main Topics  . Smoking status: Never Smoker   . Smokeless tobacco: Not on file  . Alcohol Use: Yes     Comment: socailly  . Drug Use: No  . Sexually Active: Not on file   Other Topics Concern  . Not on file   Social History Narrative   LIves w/ husband    Review of Systems: Gen: Denies any fever, chills, sweats, anorexia, fatigue, weakness, malaise, weight loss, and sleep disorder CV: Denies chest pain, angina, palpitations, syncope, orthopnea, PND, peripheral edema, and claudication. Resp: Denies dyspnea at rest, dyspnea with exercise, cough, sputum, wheezing, coughing up blood, and pleurisy. GI: Denies vomiting blood, jaundice, and fecal incontinence.   GU : Denies urinary burning, blood in urine, urinary frequency, urinary hesitancy, nocturnal urination, and urinary incontinence. MS: Denies joint pain, limitation of  movement, and swelling, stiffness, low back pain, extremity pain. Denies muscle weakness, cramps, atrophy.  Derm: Denies rash, itching, dry skin, hives, moles, warts, or unhealing ulcers.  Psych: Denies depression, anxiety, memory loss, suicidal ideation, hallucinations, paranoia, and confusion. Heme: Denies bruising, bleeding, and enlarged lymph nodes. Neuro:  Denies any headaches, dizziness, paresthesias. Endo:  Denies any problems with DM, thyroid, adrenal function.  Physical Exam: BP 146/77  Pulse 67  Temp 98.2 F (36.8 C) (Oral)  Ht 5\' 7"  (1.702 m)  Wt 141 lb (63.957 kg)  BMI 22.08 kg/m2 No  LMP recorded. Patient is postmenopausal. General:   Alert,  Well-developed, well-nourished, pleasant and cooperative in NAD Head:  Normocephalic and atraumatic. Eyes:  Sclera clear, no icterus.   Conjunctiva pink. Ears:  Normal auditory acuity. Nose:  No deformity, discharge, or lesions. Mouth:  No deformity or lesions,oropharynx pink & moist. Neck:  Supple; no masses or thyromegaly. Lungs:  Clear throughout to auscultation.   No wheezes, crackles, or rhonchi. No acute distress. Heart:  Regular rate and rhythm; no murmurs, clicks, rubs,  or gallops. Abdomen:  Normal bowel sounds.  No bruits.  Soft, non-tender and non-distended without masses, hepatosplenomegaly or hernias noted.  No guarding or rebound tenderness.   Rectal:  Deferred.  Msk:  Symmetrical without gross deformities. Normal posture. Pulses:  Normal pulses noted. Extremities:  No clubbing or edema. Neurologic:  Alert and oriented x4;  grossly normal neurologically. Skin:  Intact without significant lesions or rashes. Lymph Nodes:  No significant cervical adenopathy. Psych:  Alert and cooperative. Normal mood and affect.

## 2012-03-07 NOTE — Op Note (Signed)
[facility] [address] [place], [address]   ENDOSCOPY PROCEDURE REPORT  PATIENT: Karen Giles, Karen Giles  MR#: [id number] BIRTHDATE: September 19, 1950 , 61  yrs. old GENDER: Female ENDOSCOPIST: R.  Roetta Sessions, MD FACP FACG REFERRED BY:  Carylon Perches, M.D. PROCEDURE DATE:  03/07/2012 PROCEDURE:    EGD with [name] dilation followed by esophageal and gastric biopsy  INDICATIONS:   Recurrent esophageal dysphagia and GERD  INFORMED CONSENT:   The risks, benefits, limitations, alternatives and imponderables have been discussed.  The potential for biopsy, esophogeal dilation, etc. have also been reviewed.  Questions have been answered.  All parties agreeable.  Please see the history and physical in the medical record for more information.  MEDICATIONS: Versed 4 mg IV Inderal 75 mg IV in divided doses.  DESCRIPTION OF PROCEDURE:   The EG-[id number] ([id number])  endoscope was introduced through the mouth and advanced to the second portion of the duodenum without difficulty or limitations.  The mucosal surfaces were surveyed very carefully during advancement of the scope and upon withdrawal.  Retroflexion view of the proximal stomach and esophagogastric junction was performed.      FINDINGS: Distal esophageal erosions superimposed on an accentuated undulating Z line. I found the esophagus patent throughout its course. Stomach empty. Small hiatal hernia. Diffuse fine erosions and mottling of the gastric mucosa. No ulcer or infiltrating process. Pylorus patent. Normal first and second portion of the duodenum  THERAPEUTIC / DIAGNOSTIC MANEUVERS PERFORMED:  A 54 French Maloney dilator was passed to full insertion easily. A look back revealed a superficial tear through the upper esophageal mucosa with minimal bleeding. No apparentcomplications related to this maneuver. Subsequently, biopsies of the distal esophagus taken. Finally, please abnormal gastric mucosa were  taken   COMPLICATIONS:  None  IMPRESSION:    Erosive reflux esophagitis. Query short segment Barrett's esophagus. Probable cervical esophageal web-status post dilation as described above. Status post biopsy  RECOMMENDATIONS:   Continue Nexium 40 mg orally daily as this seems to be helping significantly with her GERD. Followup on pathology.    _______________________________ R. Roetta Sessions, MD FACP [facility] eSigned:  R. Roetta Sessions, MD FACP [facility] 03/07/2012 9:55 AM     CC:  PATIENT NAME:  Simrit, Gohlke MR#: [id number]

## 2012-03-07 NOTE — Interval H&P Note (Signed)
History and Physical Interval Note:  03/07/2012 9:15 AM  Karen Giles  has presented today for surgery, with the diagnosis of Dysphagia  The various methods of treatment have been discussed with the patient and family. After consideration of risks, benefits and other options for treatment, the patient has consented to  Procedure(s) (LRB) with comments: ESOPHAGOGASTRODUODENOSCOPY (EGD) WITH ESOPHAGEAL DILATION (N/A) - 8:30 as a surgical intervention .  The patient's history has been reviewed, patient examined, no change in status, stable for surgery.  I have reviewed the patient's chart and labs.  Questions were answered to the patient's satisfaction.     Eula Listen  Plan as outlined.The risks, benefits, limitations, alternatives and imponderables have been reviewed with the patient. Potential for esophageal dilation, biopsy, etc. have also been reviewed.  Questions have been answered. All parties agreeable.

## 2012-03-09 ENCOUNTER — Encounter: Payer: Self-pay | Admitting: Internal Medicine

## 2012-03-11 ENCOUNTER — Other Ambulatory Visit: Payer: Self-pay | Admitting: Obstetrics & Gynecology

## 2012-03-11 ENCOUNTER — Encounter: Payer: Self-pay | Admitting: *Deleted

## 2012-03-11 ENCOUNTER — Telehealth: Payer: Self-pay

## 2012-03-11 DIAGNOSIS — Z1231 Encounter for screening mammogram for malignant neoplasm of breast: Secondary | ICD-10-CM

## 2012-03-11 MED ORDER — BIS SUBCIT-METRONID-TETRACYC 140-125-125 MG PO CAPS: 3.0000 | ORAL_CAPSULE | Freq: Three times a day (TID) | ORAL | Status: DC

## 2012-03-11 NOTE — Telephone Encounter (Signed)
Pt aware, rx sent to [facility]   Pt stated the nexium is making her feel worse and pt is requesting prilosec. Pt wants to know if she can try samples of prilosec while she is taking the pylera. Please advise.

## 2012-03-11 NOTE — Telephone Encounter (Signed)
She may take Prilosec 20 mg twice daily during therapy with pylera- that is twice a day every day during the 10 day course of therapy without fail. I don't understand why she's having trouble with Nexium versus Prilosec as they are basically the same medication.

## 2012-03-11 NOTE — Telephone Encounter (Signed)
Path and letter faxed to PCP, letter mailed to pt 

## 2012-03-11 NOTE — Telephone Encounter (Signed)
Letter from: Corbin Ade Reason for Letter: Results Review Send letter to patient.   Send copy of letter with path to referring provider and PCP.  Karen Giles : needs treatment for HP :Pylera x 10 days ; increase nexium to bid during this time; then decrease to once daily thereafter

## 2012-03-11 NOTE — Telephone Encounter (Signed)
Pt aware, samples at the front desk.

## 2012-03-12 ENCOUNTER — Encounter (HOSPITAL_COMMUNITY): Payer: Self-pay | Admitting: Internal Medicine

## 2012-03-12 ENCOUNTER — Other Ambulatory Visit: Payer: Self-pay | Admitting: Nurse Practitioner

## 2012-03-12 DIAGNOSIS — Z780 Asymptomatic menopausal state: Secondary | ICD-10-CM

## 2012-03-12 DIAGNOSIS — Z8739 Personal history of other diseases of the musculoskeletal system and connective tissue: Secondary | ICD-10-CM

## 2012-05-13 ENCOUNTER — Ambulatory Visit
Admission: RE | Admit: 2012-05-13 | Discharge: 2012-05-13 | Disposition: A | Discharge status: Home / Self Care | Payer: BC Managed Care – PPO | Source: Ambulatory Visit | Attending: Nurse Practitioner | Admitting: Nurse Practitioner

## 2012-05-13 ENCOUNTER — Ambulatory Visit
Admission: RE | Admit: 2012-05-13 | Discharge: 2012-05-13 | Disposition: A | Discharge status: Home / Self Care | Payer: BC Managed Care – PPO | Source: Ambulatory Visit | Attending: Obstetrics & Gynecology | Admitting: Obstetrics & Gynecology

## 2012-05-13 DIAGNOSIS — Z780 Asymptomatic menopausal state: Secondary | ICD-10-CM

## 2012-05-13 DIAGNOSIS — Z1231 Encounter for screening mammogram for malignant neoplasm of breast: Secondary | ICD-10-CM

## 2012-05-13 DIAGNOSIS — Z8739 Personal history of other diseases of the musculoskeletal system and connective tissue: Secondary | ICD-10-CM

## 2012-07-01 ENCOUNTER — Encounter: Payer: Self-pay | Admitting: Nurse Practitioner

## 2012-07-01 ENCOUNTER — Ambulatory Visit (INDEPENDENT_AMBULATORY_CARE_PROVIDER_SITE_OTHER): Payer: BC Managed Care – PPO | Admitting: Nurse Practitioner

## 2012-07-01 VITALS — BP 128/76 | HR 66 | Ht 65.0 in | Wt 138.4 lb

## 2012-07-01 DIAGNOSIS — N952 Postmenopausal atrophic vaginitis: Secondary | ICD-10-CM

## 2012-07-01 DIAGNOSIS — Z01419 Encounter for gynecological examination (general) (routine) without abnormal findings: Secondary | ICD-10-CM

## 2012-07-01 MED ORDER — ESTRADIOL 2 MG VA RING: 2.0000 mg | VAGINAL_RING | VAGINAL | Status: DC

## 2012-07-01 NOTE — Patient Instructions (Addendum)

## 2012-07-01 NOTE — Progress Notes (Signed)
62 y.o. Married Caucasian Fe here for annual exam.  She feels well.  Estring is helping with vaginal dryness (but is expensive on her [employer] and they do not allow coupons). In January diagnosed with H pylori and had treatment  She has 2 grandchildren and loves being retired and visiting wit them.  No LMP recorded. Patient is postmenopausal.          Sexually active: yes  The current method of family planning is status post menopausal status.    Exercising: yes  Home exercise routine includes cardio . Smoker:  no  Health Maintenance: Pap:  06/13/2011 normal with negative HR HPV MMG:  05/10/2012 normal Colonoscopy:  2005 repeat in 10 years BMD:   05/10/2012 T Score: spine -2.1; left hip neck -1.6 (stable) TDaP:  2012; Shingles 2011 Labs: PCP checks urine and monitors blood work   reports that she has never smoked. She does not have any smokeless tobacco history on file. She reports that  drinks alcohol. She reports that she does not use illicit drugs.  Past Medical History  Diagnosis Date  . HTN (hypertension)   . GERD (gastroesophageal reflux disease)   . H. pylori infection 02/2012    treated with pylera    Past Surgical History  Procedure Laterality Date  . Esophagogastroduodenoscopy    08/10/2003    RMR: Possible cervical esophageal web status post dilation/ Otherwise normal esophagus, stomach, and first and second portions of the duodenum  . Colonoscopy    08/10/2003    RMR: Normal rectum and colon  . Esophagogastroduodenoscopy (egd) with esophageal dilation  03/07/2012    Procedure: ESOPHAGOGASTRODUODENOSCOPY (EGD) WITH ESOPHAGEAL DILATION;  Surgeon: Corbin Ade, MD;  [facility]: [facility];  Service: Endoscopy;  Laterality: N/A;  8:30    Current Outpatient Prescriptions  Medication Sig Dispense Refill  . bismuth-metronidazole-tetracycline (PYLERA) 140-125-125 MG per capsule Take 3 capsules by mouth 4 (four) times daily -  before meals and at bedtime.  120  capsule  0  . chlorthalidone (HYGROTON) 25 MG tablet Take 12.5 mg by mouth daily.       Marland Kitchen esomeprazole (NEXIUM) 40 MG capsule Take 1 capsule (40 mg total) by mouth daily before breakfast.  30 capsule  2  . ESTRING 2 MG vaginal ring 1 applicator Every 3 months.       No current facility-administered medications for this visit.    Family History  Problem Relation Age of Onset  . Colon cancer Neg Hx     ROS:  Pertinent items are noted in HPI.  Otherwise, a comprehensive ROS was negative.  Exam:   There were no vitals taken for this visit.    Ht Readings from Last 3 Encounters:  03/07/12 5\' 7"  (1.702 m)  03/07/12 5\' 7"  (1.702 m)  03/04/12 5\' 7"  (1.702 m)    General appearance: alert, cooperative and appears stated age Head: Normocephalic, without obvious abnormality, atraumatic Neck: no adenopathy, supple, symmetrical, trachea midline and thyroid normal to inspection and palpation Lungs: clear to auscultation bilaterally Breasts: normal appearance, no masses or tenderness Heart: regular rate and rhythm Abdomen: soft, non-tender; no masses,  no organomegaly Extremities: extremities normal, atraumatic, no cyanosis or edema Skin: Skin color, texture, turgor normal. No rashes or lesions Lymph nodes: Cervical, supraclavicular, and axillary nodes normal. No abnormal inguinal nodes palpated Neurologic: Grossly normal   Pelvic: External genitalia:  no lesions              Urethra:  normal appearing urethra with no masses, tenderness or lesions              Bartholin's and Skene's: normal                 Vagina: atrophic appearing vagina with pale color and no discharge, no lesions              Cervix: anteverted              Pap taken: no Bimanual Exam:  Uterus:  normal size, contour, position, consistency, mobility, non-tender              Adnexa: no mass, fullness, tenderness               Rectovaginal: Confirms               Anus:  normal sphincter tone, no lesions  A:  Well  Woman with normal exam  Postmenopausal  Atrophic Vaginitis recently off Estring  P:   Pap smear as per guidelines (may repeat next year before Medicare)  Mammogram due 04/2013  counseled on breast self exam, osteoporosis, adequate intake of calcium and vitamin D,  diet and exercise  Plan to recheck Vit D here or at PCP for next year  Refill Estring Vaginal ring X 1 year  return annually or prn  Patient prefers to see MD on every other visit like she did when Dr. Marisella Puccio Pesa was here, she saw Dr. Hyacinth Meeker last  year but OK to see any of the MD's on next year.  An After Visit Summary was printed and given to the patient.

## 2012-07-04 NOTE — Progress Notes (Signed)
Encounter reviewed by Dr. Vaunda Gutterman Silva.  

## 2013-04-23 ENCOUNTER — Other Ambulatory Visit: Payer: Self-pay

## 2013-04-23 DIAGNOSIS — Z1231 Encounter for screening mammogram for malignant neoplasm of breast: Secondary | ICD-10-CM

## 2013-06-09 ENCOUNTER — Ambulatory Visit
Admission: RE | Admit: 2013-06-09 | Discharge: 2013-06-09 | Disposition: A | Discharge status: Home / Self Care | Payer: BC Managed Care – PPO | Source: Ambulatory Visit

## 2013-06-09 DIAGNOSIS — Z1231 Encounter for screening mammogram for malignant neoplasm of breast: Secondary | ICD-10-CM

## 2013-07-04 ENCOUNTER — Ambulatory Visit: Payer: BC Managed Care – PPO | Admitting: Obstetrics & Gynecology

## 2013-08-18 DIAGNOSIS — K589 Irritable bowel syndrome without diarrhea: Secondary | ICD-10-CM | POA: Insufficient documentation

## 2013-08-19 ENCOUNTER — Ambulatory Visit (INDEPENDENT_AMBULATORY_CARE_PROVIDER_SITE_OTHER): Payer: BC Managed Care – PPO | Admitting: Nurse Practitioner

## 2013-08-19 ENCOUNTER — Encounter: Payer: Self-pay | Admitting: Nurse Practitioner

## 2013-08-19 VITALS — BP 116/74 | HR 68 | Ht 65.5 in | Wt 137.0 lb

## 2013-08-19 DIAGNOSIS — R82998 Other abnormal findings in urine: Secondary | ICD-10-CM

## 2013-08-19 DIAGNOSIS — Z01419 Encounter for gynecological examination (general) (routine) without abnormal findings: Secondary | ICD-10-CM

## 2013-08-19 DIAGNOSIS — Z0000 Encounter for general adult medical examination without abnormal findings: Secondary | ICD-10-CM

## 2013-08-19 DIAGNOSIS — R8290 Unspecified abnormal findings in urine: Secondary | ICD-10-CM

## 2013-08-19 LAB — POCT URINALYSIS DIPSTICK
Bilirubin, UA: NEGATIVE
Blood, UA: NEGATIVE
Glucose, UA: NEGATIVE
Ketones, UA: NEGATIVE
Nitrite, UA: NEGATIVE
PROTEIN UA: NEGATIVE
UROBILINOGEN UA: NEGATIVE
pH, UA: 5

## 2013-08-19 MED ORDER — ESTRADIOL 2 MG VA RING: 2.0000 mg | VAGINAL_RING | VAGINAL | Status: DC

## 2013-08-19 NOTE — Patient Instructions (Signed)

## 2013-08-19 NOTE — Progress Notes (Signed)
63 y.o. G5P1000 Married Caucasian Fe here for annual exam.  She has done well this past year.  Most recently has felt "a sensation in the vagina" over the past 6 months that is intermittent.  No associated itching, burning, odor or discomfort.  Unsure if related to bladder or vagina.  She denies urinary symptoms of urgency, frequency or dysuria.  Seems to have been related to vaginal dryness after she took the Estring out and has not replaced.  She is waiting to get on Medicare in July to get RX filled.  Patient's last menstrual period was 02/27/1993.          Sexually active: yes  The current method of family planning is none.    Exercising: yes  Home exercise routine includes cardio and weights. Smoker:  no  Health Maintenance: Pap:  05/2011, Normal MMG:  05/2013, WNL Colonoscopy:  2005, Normal Apt set 08/2013 BMD:   04/2012, Low Bone Mass; T Score:  spine  -2.1; left hip -1.6 TDaP:  03/2009 Labs: PCP  Urine: Leuk-trace   reports that she has never smoked. She has never used smokeless tobacco. She reports that she drinks about .6 ounces of alcohol per week. She reports that she does not use illicit drugs.  Past Medical History  Diagnosis Date  . HTN (hypertension)   . GERD (gastroesophageal reflux disease)   . H. pylori infection 02/2012    treated with pylera    Past Surgical History  Procedure Laterality Date  . Esophagogastroduodenoscopy    08/10/2003    RMR: Possible cervical esophageal web status post dilation/ Otherwise normal esophagus, stomach, and first and second portions of the duodenum  . Colonoscopy    08/10/2003    RMR: Normal rectum and colon  . Esophagogastroduodenoscopy (egd) with esophageal dilation  03/07/2012    Procedure: ESOPHAGOGASTRODUODENOSCOPY (EGD) WITH ESOPHAGEAL DILATION;  Surgeon: Daneil Dolin, MD;  Location: [facility];  Service: Endoscopy;  Laterality: N/A;  8:30    Current Outpatient Prescriptions  Medication Sig Dispense Refill  .  chlorthalidone (HYGROTON) 25 MG tablet Take 12.5 mg by mouth daily.       Marland Kitchen omeprazole (PRILOSEC) 20 MG capsule Take 20 mg by mouth daily.      Marland Kitchen estradiol (ESTRING) 2 MG vaginal ring Place 2 mg vaginally every 3 (three) months.  1 each  3   No current facility-administered medications for this visit.    Family History  Problem Relation Age of Onset  . Colon cancer Neg Hx   . Heart failure Mother   . Cancer Maternal Grandmother   . Cancer Maternal Grandfather   . Stroke Paternal Grandmother   . Heart failure Paternal Grandmother   . Cancer Paternal Grandfather     ROS:  Pertinent items are noted in HPI.  Otherwise, a comprehensive ROS was negative.  Exam:   BP 116/74  Pulse 68  Ht 5' 5.5" (1.664 m)  Wt 137 lb (62.143 kg)  BMI 22.44 kg/m2  LMP 02/27/1993 Height: 5' 5.5" (166.4 cm)  Ht Readings from Last 3 Encounters:  08/19/13 5' 5.5" (1.664 m)  07/01/12 5\' 5"  (1.651 m)  03/07/12 5\' 7"  (1.702 m)    General appearance: alert, cooperative and appears stated age Head: Normocephalic, without obvious abnormality, atraumatic Neck: no adenopathy, supple, symmetrical, trachea midline and thyroid normal to inspection and palpation Lungs: clear to auscultation bilaterally Breasts: normal appearance, no masses or tenderness Heart: regular rate and rhythm Abdomen: soft, non-tender; no masses,  no organomegaly Extremities: extremities normal, atraumatic, no cyanosis or edema Skin: Skin color, texture, turgor normal. No rashes or lesions Lymph nodes: Cervical, supraclavicular, and axillary nodes normal. No abnormal inguinal nodes palpated Neurologic: Grossly normal   Pelvic: External genitalia:  no lesions              Urethra:  normal appearing urethra with no masses, tenderness or lesions              Bartholin's and Skene's: normal                 Vagina:atrophic appearing vagina with pale color and discharge, no lesions, no signs of infection.              Cervix: anteverted               Pap taken: yes Bimanual Exam:  Uterus:  normal size, contour, position, consistency, mobility, non-tender              Adnexa: no mass, fullness, tenderness               Rectovaginal: Confirms               Anus:  normal sphincter tone, no lesions  A:  Well Woman with normal exam  Postmenopausal on HRT 2004 - 08/2008  Atrophic vaginitis - currently off Estring  R/O UTI  History of HTN, GERD  P:   Reviewed health and wellness pertinent to exam  Pap smear taken today  Mammogram is due 4/16  Follow with urine culture  Refill Estring every 3 months for a year  Counseled on risk of DVT, CVA, cancer, etc.  Counseled on breast self exam, mammography screening, use and side effects of HRT, adequate intake of calcium and vitamin D, diet and exercise return annually or prn  An After Visit Summary was printed and given to the patient.

## 2013-08-20 LAB — URINE CULTURE

## 2013-08-21 LAB — IPS PAP TEST WITH HPV

## 2013-08-25 NOTE — Progress Notes (Signed)
Encounter reviewed by Dr. Brook Silva.  

## 2013-12-29 ENCOUNTER — Encounter: Payer: Self-pay | Admitting: Nurse Practitioner

## 2014-07-06 ENCOUNTER — Other Ambulatory Visit: Payer: Self-pay

## 2014-07-06 DIAGNOSIS — Z1231 Encounter for screening mammogram for malignant neoplasm of breast: Secondary | ICD-10-CM

## 2014-08-10 ENCOUNTER — Ambulatory Visit
Admission: RE | Admit: 2014-08-10 | Discharge: 2014-08-10 | Disposition: A | Discharge status: Home / Self Care | Payer: Medicare Other | Source: Ambulatory Visit

## 2014-08-10 DIAGNOSIS — Z1231 Encounter for screening mammogram for malignant neoplasm of breast: Secondary | ICD-10-CM

## 2014-09-17 ENCOUNTER — Ambulatory Visit: Payer: BC Managed Care – PPO | Admitting: Obstetrics & Gynecology

## 2014-09-17 ENCOUNTER — Ambulatory Visit (INDEPENDENT_AMBULATORY_CARE_PROVIDER_SITE_OTHER): Payer: Medicare Other | Admitting: Obstetrics and Gynecology

## 2014-09-17 ENCOUNTER — Encounter: Payer: Self-pay | Admitting: Obstetrics and Gynecology

## 2014-09-17 VITALS — BP 106/80 | HR 74 | Ht 65.75 in | Wt 140.6 lb

## 2014-09-17 DIAGNOSIS — Z0000 Encounter for general adult medical examination without abnormal findings: Secondary | ICD-10-CM | POA: Diagnosis not present

## 2014-09-17 DIAGNOSIS — Z01419 Encounter for gynecological examination (general) (routine) without abnormal findings: Secondary | ICD-10-CM

## 2014-09-17 DIAGNOSIS — N952 Postmenopausal atrophic vaginitis: Secondary | ICD-10-CM | POA: Diagnosis not present

## 2014-09-17 DIAGNOSIS — M8580 Other specified disorders of bone density and structure, unspecified site: Secondary | ICD-10-CM | POA: Diagnosis not present

## 2014-09-17 LAB — POCT URINALYSIS DIPSTICK
LEUKOCYTES UA: NEGATIVE
PH UA: 5
Urobilinogen, UA: NEGATIVE

## 2014-09-17 MED ORDER — ESTRADIOL 2 MG VA RING: 2.0000 mg | VAGINAL_RING | VAGINAL | Status: DC

## 2014-09-17 NOTE — Progress Notes (Signed)
64 y.o. G74P1000 Married Caucasian female here for annual exam.  She has frequent urination, worse on the diuretic. Goes normal amounts. No leakage. No vaginal bleeding. She is sexually active, feels dry, hasn't tried lubricant. She was doing well on the estring, stopped using it secondary to expense, wants to use it again.   PCP:   Asencion Noble, MD (In Epic)  Patient's last menstrual period was 02/27/1993.          Sexually active: Yes.    The current method of family planning is post menopausal status.    Exercising: Yes.    power flex (weights), ellipitcal, walk 5x/wk Smoker:  no  Health Maintenance: Pap:  08/19/13 wnl neg hr hpv History of abnormal Pap:  no MMG:  08/10/14 breast density b; bi-rads 1; negative Colonoscopy: 11/2013 small polyp 2020 BMD:   05/13/2012  Result : T Score: spine -2.1; left hip neck -1.6 (stable) TDaP:  2013 Screening Labs:  Hb today: PCP , Urine today: Negative   reports that she has never smoked. She has never used smokeless tobacco. She reports that she drinks about 1.2 oz of alcohol per week. She reports that she does not use illicit drugs.  Past Medical History  Diagnosis Date  . HTN (hypertension)   . GERD (gastroesophageal reflux disease)   . H. pylori infection 02/2012    treated with pylera    Past Surgical History  Procedure Laterality Date  . Esophagogastroduodenoscopy    08/10/2003    RMR: Possible cervical esophageal web status post dilation/ Otherwise normal esophagus, stomach, and first and second portions of the duodenum  . Colonoscopy    08/10/2003    RMR: Normal rectum and colon  . Esophagogastroduodenoscopy (egd) with esophageal dilation  03/07/2012    Procedure: ESOPHAGOGASTRODUODENOSCOPY (EGD) WITH ESOPHAGEAL DILATION;  Surgeon: Daneil Dolin, MD;  [facility];  Service: Endoscopy;  Laterality: N/A;  8:30    Current Outpatient Prescriptions  Medication Sig Dispense Refill  . chlorthalidone (HYGROTON) 25 MG tablet Take 12.5  mg by mouth daily.      No current facility-administered medications for this visit.    Family History  Problem Relation Age of Onset  . Colon cancer Neg Hx   . Heart failure Mother   . Cancer Maternal Grandmother   . Cancer Maternal Grandfather   . Stroke Paternal Grandmother   . Heart failure Paternal Grandmother   . Cancer Paternal Grandfather     ROS:  Pertinent items are noted in HPI.  Otherwise, a comprehensive ROS was negative.  Exam:   BP 106/80 mmHg  Pulse 74  Ht 5' 5.75" (1.67 m)  Wt 140 lb 9.6 oz (63.776 kg)  BMI 22.87 kg/m2  LMP 02/27/1993    General appearance: alert, cooperative and appears stated age Head: Normocephalic, without obvious abnormality, atraumatic Neck: no adenopathy, supple, symmetrical, trachea midline and thyroid normal to inspection and palpation Lungs: clear to auscultation bilaterally Breasts: normal appearance, no masses or tenderness Heart: regular rate and rhythm Abdomen: soft, non-tender; bowel sounds normal; no masses,  no organomegaly Extremities: extremities normal, atraumatic, no cyanosis or edema Skin: Skin color, texture, turgor normal. No rashes or lesions Lymph nodes: Cervical, supraclavicular, and axillary nodes normal. No abnormal inguinal nodes palpated Neurologic: Grossly normal  Pelvic: External genitalia:  no lesions              Urethra:  normal appearing urethra with no masses, tenderness or lesions  Bartholins and Skenes: normal                 Vagina: atrophic appearing vagina with normal color, no lesions              Cervix: no lesions              Pap taken: No. Bimanual Exam:  Uterus:  normal size, contour, position, consistency, mobility, non-tender and anteverted              Adnexa: normal adnexa and no mass, fullness, tenderness              Rectovaginal: Yes.  .  Confirms.              Anus:  normal sphincter tone, no lesions  Chaperone was present for exam.  Assessment:   Well woman visit  with normal exam. Osteopenia Vaginal atrophy   Plan: Yearly mammogram recommended after age 77.  Recommended self breast exam.  No pap this year Discussed Calcium, Vitamin D, regular exercise program including cardiovascular and weight bearing exercise. Labs with primary Refills given on medications.  Yes.  .  See orders. Follow up annually and prn.  Try a lubricant with intercourse, wants to restart estring DEXA due  After visit summary provided.

## 2014-09-17 NOTE — Patient Instructions (Signed)

## 2014-09-18 NOTE — Addendum Note (Signed)
Addended by: Michele Mcalpine on: 09/18/2014 10:19 AM   Modules accepted: Orders

## 2014-11-24 ENCOUNTER — Ambulatory Visit: Payer: Medicare Other | Admitting: Podiatry

## 2014-12-03 ENCOUNTER — Ambulatory Visit (INDEPENDENT_AMBULATORY_CARE_PROVIDER_SITE_OTHER): Payer: Medicare Other | Admitting: Podiatry

## 2014-12-03 ENCOUNTER — Ambulatory Visit (INDEPENDENT_AMBULATORY_CARE_PROVIDER_SITE_OTHER): Payer: Medicare Other

## 2014-12-03 ENCOUNTER — Encounter: Payer: Self-pay | Admitting: Podiatry

## 2014-12-03 VITALS — BP 125/73 | HR 68 | Resp 12

## 2014-12-03 DIAGNOSIS — M79673 Pain in unspecified foot: Secondary | ICD-10-CM

## 2014-12-03 DIAGNOSIS — M205X9 Other deformities of toe(s) (acquired), unspecified foot: Secondary | ICD-10-CM

## 2014-12-03 NOTE — Patient Instructions (Signed)
Pre-Operative Instructions  Congratulations, you have decided to take an important step to improving your quality of life.  You can be assured that the doctors of [facility] will be with you every step of the way.  1. Plan to be at the surgery center/hospital at least 1 (one) hour prior to your scheduled time unless otherwise directed by the surgical center/hospital staff.  You must have a responsible adult accompany you, remain during the surgery and drive you home.  Make sure you have directions to the surgical center/hospital and know how to get there on time. 2. For hospital based surgery you will need to obtain a history and physical form from your family physician within 1 month prior to the date of surgery- we will give you a form for you primary physician.  3. We make every effort to accommodate the date you request for surgery.  There are however, times where surgery dates or times have to be moved.  We will contact you as soon as possible if a change in schedule is required.   4. No Aspirin/Ibuprofen for one week before surgery.  If you are on aspirin, any non-steroidal anti-inflammatory medications (Mobic, Aleve, Ibuprofen) you should stop taking it 7 days prior to your surgery.  You make take Tylenol  For pain prior to surgery.  5. Medications- If you are taking daily heart and blood pressure medications, seizure, reflux, allergy, asthma, anxiety, pain or diabetes medications, make sure the surgery center/hospital is aware before the day of surgery so they may notify you which medications to take or avoid the day of surgery. 6. No food or drink after midnight the night before surgery unless directed otherwise by surgical center/hospital staff. 7. No alcoholic beverages 24 hours prior to surgery.  No smoking 24 hours prior to or 24 hours after surgery. 8. Wear loose pants or shorts- loose enough to fit over bandages, boots, and casts. 9. No slip on shoes, sneakers are best. 10. Bring  your boot with you to the surgery center/hospital.  Also bring crutches or a walker if your physician has prescribed it for you.  If you do not have this equipment, it will be provided for you after surgery. 11. If you have not been contracted by the surgery center/hospital by the day before your surgery, call to confirm the date and time of your surgery. 12. Leave-time from work may vary depending on the type of surgery you have.  Appropriate arrangements should be made prior to surgery with your employer. 13. Prescriptions will be provided immediately following surgery by your doctor.  Have these filled as soon as possible after surgery and take the medication as directed. 14. Remove nail polish on the operative foot. 15. Wash the night before surgery.  The night before surgery wash the foot and leg well with the antibacterial soap provided and water paying special attention to beneath the toenails and in between the toes.  Rinse thoroughly with water and dry well with a towel.  Perform this wash unless told not to do so by your physician.  Enclosed: 1 Ice pack (please put in freezer the night before surgery)   1 Hibiclens skin cleaner   Pre-op Instructions  If you have any questions regarding the instructions, do not hesitate to call our office.  [place]: [address] [place], [place] [address] [phone]  [place]: [address] [place], [place] [address] [phone]  [place]: [address]  [place],  [address] [phone]  Dr. Richard   Tuchman DPM, Dr. Norman Regal DPM Dr. Richard Sikora DPM, Dr. M. Todd Hyatt DPM, Dr. Kathryn Egerton DPM 

## 2014-12-03 NOTE — Progress Notes (Signed)
   Subjective:    Patient ID: Karen Giles, female    DOB: [birth year], 64 y.o.   MRN: [id number]  HPI she presents today with a chief complaint of a 5 year duration of painful first metatarsophalangeal joints. She states they've been worsening over the past few years and getting to the point now can hardly walk. I 5 Tylenol to no avail. Try different shoe gear nothing seems to help. She denies trauma to the feet.    Review of Systems  Genitourinary: Positive for frequency.       Objective:   Physical Exam: Vital signs are stable she is alert and oriented 3. In no apparent distress Pulses are solid palpable bilateral. Neurologic sensorium is intact per Semmes-Weinstein monofilament. Deep tendon reflexes intact bilateral muscle strength +5 over 5 dorsiflexion plantar flexors and inverters everters all intrinsic musculature is intact. Orthopedic evaluation demonstrates all joints distal to the ankle full range of motion without crepitation with exception of the first metatarsophalangeal joint right foot which is approximately 0 of dorsiflexion about 10 of plantarflexion. She has approximately 10 of dorsiflexion on the left foot in 10 of plantarflexion on the left foot. Radiographs 3 views bilateral foot taken today in the office demonstrate severe osteoarthritic changes first metatarsophalangeal joint right greater than left joint space narrowing subchondral sclerosis and dorsal spurring. Hallux rigidus first metatarsophalangeal joint right foot hallux limitus first metatarsophalangeal joint left foot. Cutaneous evaluation and straight supple well-hydrated cutis no erythema edema saline as drainage or odor.        Assessment & Plan:  Hallux limitus and hallux rigidus bilateral.  Plan: We discussed the etiology pathology conservative versus surgical therapies. We discussed the need for surgical intervention at this point. I gave her the option of either an arthroplasty with implant or  fusion. She chose an arthroplasty with an implant. We discussed this in great detail today. We consented her with a surgical consent form as which we went over line by line number by number giving her ample time to ask questions she saw fit regarding a Keller arthroplasty with single silicone implant bilateral foot. I answered all the questions regarding these procedures to the best of my ability and [employer] terms she understood it was amenable to it and signed all 3 pages of the consent form. She understands that this may be have postop complications which could include but are not limited to postop pain bleeding swelling infection recurrence need for further surgery rejection of the implant overcorrection or under correction loss of digit loss of limb loss of life development of blood clots and pulmonary emboli. She signed all 3 pages of the consent form and I will follow-up with her in the near future.  Roselind Messier DPM

## 2014-12-04 ENCOUNTER — Telehealth: Payer: Self-pay | Admitting: *Deleted

## 2014-12-04 NOTE — Telephone Encounter (Signed)
"  I would like to get an estimate for my surgery scheduled in January.  Can you tell me if it will need pre-authorization?  I'll have to call you back with that information.  It has to be researched and calculated.

## 2014-12-09 NOTE — Telephone Encounter (Signed)
I called and left patient a message that her insurance will cover at 100%.  There is a $250 co-pay for facility fee.  As far as your trip is concerned, you should be okay for your trip in May.  It takes about 6-8 weeks for actual bone healing.  Please call if you have further questions.

## 2014-12-09 NOTE — Telephone Encounter (Signed)
"  Could you return my call please.  I have a question about recovery period for surgery I'm having in January.  Also I called you last week about my insurance.  Please give me a call because I'm scheduled to go out of the country in May."

## 2015-02-15 ENCOUNTER — Telehealth: Payer: Self-pay | Admitting: *Deleted

## 2015-02-15 NOTE — Telephone Encounter (Signed)
Authorization was obtained for surgery scheduled for 03/12/2015 for cpt code 28293 by [facility].  Authorization number is [id number].

## 2015-03-02 ENCOUNTER — Telehealth: Payer: Self-pay | Admitting: *Deleted

## 2015-03-02 NOTE — Telephone Encounter (Signed)
"  I have had the flu for two weeks.  I went to the doctor and they said it was a mild Pneumonia and the suggested I hold off on my surgery.  So, I'm calling to see if we can push it back a week."  He doesn't have anything until February 10.  "Okay, that will be fine put me down then."  I will take care of it.  Hope you feel better soon.  I called and asked Renee at [facility] to reschedule her surgery from 03/12/2015 to 04/09/2015.  Patient has Pneumonia.  "Alright, you got it."

## 2015-03-09 ENCOUNTER — Telehealth: Payer: Self-pay | Admitting: *Deleted

## 2015-03-09 NOTE — Telephone Encounter (Signed)
I'm calling in regards to your surgery scheduled for 04/09/2015.  We need to reschedule your surgery.  Can we reschedule it to either 04/02/2015 or 04/16/2015?  "I'd love to do it February 3rd, the sooner the better."  Okay, I appreciate it.  We'll get it rescheduled to 04/02/2015.  "Will someone call me when it gets closer with the time?"  Surgical center will call you a day or two prior to with the arrival time.  "Will everything be taken care of with my insurance?  I know it was approved."  I will call and change the date with your insurance.  "Thank you so much."

## 2015-03-18 ENCOUNTER — Encounter: Payer: Self-pay | Admitting: Podiatry

## 2015-03-29 ENCOUNTER — Telehealth: Payer: Self-pay | Admitting: *Deleted

## 2015-03-29 NOTE — Telephone Encounter (Addendum)
Pt states she is having surgery on Friday 04/02/2015, but has fallen and her left big toe is bruise, would this cause a delay in her surgery?  03/31/2015- I informed pt of Dr. Stephenie Acres statement and pt is scheduled to come in 04/01/2015.

## 2015-03-29 NOTE — Telephone Encounter (Signed)
Yes she needs and xray to make sure not fracture since that is what we are working on.

## 2015-04-01 ENCOUNTER — Ambulatory Visit (INDEPENDENT_AMBULATORY_CARE_PROVIDER_SITE_OTHER): Payer: Medicare Other | Admitting: Podiatry

## 2015-04-01 ENCOUNTER — Encounter: Payer: Self-pay | Admitting: Podiatry

## 2015-04-01 ENCOUNTER — Ambulatory Visit (INDEPENDENT_AMBULATORY_CARE_PROVIDER_SITE_OTHER): Payer: Medicare Other

## 2015-04-01 ENCOUNTER — Other Ambulatory Visit: Payer: Self-pay | Admitting: Podiatry

## 2015-04-01 VITALS — BP 110/78 | HR 75 | Resp 16

## 2015-04-01 DIAGNOSIS — S92912A Unspecified fracture of left toe(s), initial encounter for closed fracture: Secondary | ICD-10-CM

## 2015-04-01 DIAGNOSIS — M79675 Pain in left toe(s): Secondary | ICD-10-CM

## 2015-04-01 MED ORDER — OXYCODONE-ACETAMINOPHEN 10-325 MG PO TABS: ORAL_TABLET | ORAL | Status: DC

## 2015-04-01 MED ORDER — PROMETHAZINE HCL 25 MG PO TABS: 25.0000 mg | ORAL_TABLET | Freq: Three times a day (TID) | ORAL | Status: DC | PRN

## 2015-04-01 MED ORDER — CLINDAMYCIN HCL 150 MG PO CAPS: 150.0000 mg | ORAL_CAPSULE | Freq: Three times a day (TID) | ORAL | Status: DC

## 2015-04-02 ENCOUNTER — Encounter: Payer: Self-pay | Admitting: Podiatry

## 2015-04-02 DIAGNOSIS — M2011 Hallux valgus (acquired), right foot: Secondary | ICD-10-CM | POA: Diagnosis not present

## 2015-04-04 NOTE — Progress Notes (Signed)
She presents today she is scheduled for bilateral Keller arthroplasty with single silicone implant tomorrow. She states that she tripped down the steps and folded her left great toe underneath her foot and landed on it. She states is black and blue and swollen and is painful. She is concerned he doesn't want to be broken.  Objective: Vital signs are stable alert and oriented 3. Pulses are palpable. Her left great toe does demonstrate ecchymosis and edema. Radiographs taken today do demonstrate a nondisplaced non-comminuted fracture of the proximal phalanx. This will inhibit [place] from performing the [facility] arthroplasty with implant.  Assessment: Fractured proximal phalanx hallux left.  Plan: We will reschedule her surgery for her left foot but continue on with the surgery for the right foot more.

## 2015-04-09 ENCOUNTER — Ambulatory Visit (INDEPENDENT_AMBULATORY_CARE_PROVIDER_SITE_OTHER): Payer: Medicare Other | Admitting: Podiatry

## 2015-04-09 ENCOUNTER — Ambulatory Visit (INDEPENDENT_AMBULATORY_CARE_PROVIDER_SITE_OTHER): Payer: Medicare Other

## 2015-04-09 VITALS — BP 126/90 | HR 78 | Resp 16

## 2015-04-09 DIAGNOSIS — S92912A Unspecified fracture of left toe(s), initial encounter for closed fracture: Secondary | ICD-10-CM

## 2015-04-09 DIAGNOSIS — M205X9 Other deformities of toe(s) (acquired), unspecified foot: Secondary | ICD-10-CM

## 2015-04-09 DIAGNOSIS — Z9889 Other specified postprocedural states: Secondary | ICD-10-CM | POA: Diagnosis not present

## 2015-04-12 ENCOUNTER — Telehealth: Payer: Self-pay | Admitting: *Deleted

## 2015-04-12 NOTE — Telephone Encounter (Signed)
Pt states she saw Dr. Milinda Pointer initially and then another doctor last week and she wanted to get some information concerning her next surgery. I spoke with pt, she states Dr. Milinda Pointer had told her she would be able to schedule her other foot in about 3 weeks, but she was wondering if she could schedule now.

## 2015-04-12 NOTE — Progress Notes (Signed)
Subjective:     Patient ID: Karen Giles, female   DOB: 07/22/1950, 65 y.o.   MRN: [id number]  HPI patient presents stating I'm doing fine with my foot and I like to get the other one done hopefully in the next few weeks   Review of Systems     Objective:   Physical Exam  neurovascular status intact muscle strength adequate negative Homans sign noted with well-healing surgical site right first MPJ with good range of motion and no crepitus within the joint    Assessment:      doing well post implantation procedure first MPJ right    Plan:      reviewed x-ray and condition and at this point recommended continued elevation compression immobilization and reapplied sterile dressing. Reappoint in approximately 2 weeks or earlier and they can discuss correction of the left foot   X-ray report indicates implant is in good alignment with joint spaced properly and hallux in rectus position

## 2015-04-13 NOTE — Telephone Encounter (Signed)
I'm returning your call from yesterday.  Dr. Stephenie Acres earliest available date for surgery is March 10.  "That date will be fine, thank you so much."  You do not have to register again since you just recently had surgery.  The surgical center will call you with the arrival time.  "Okay thank you."

## 2015-04-13 NOTE — Telephone Encounter (Signed)
Yes certainly.  I had asked Delydia to go ahead and put her in for three weeks after her initial surgery.  You may want to check with her to see if she is scheduled already.

## 2015-04-15 ENCOUNTER — Encounter: Payer: Self-pay | Admitting: Podiatry

## 2015-04-22 ENCOUNTER — Encounter: Payer: Self-pay | Admitting: Podiatry

## 2015-04-22 ENCOUNTER — Ambulatory Visit (INDEPENDENT_AMBULATORY_CARE_PROVIDER_SITE_OTHER): Payer: Medicare Other | Admitting: Podiatry

## 2015-04-22 ENCOUNTER — Ambulatory Visit (INDEPENDENT_AMBULATORY_CARE_PROVIDER_SITE_OTHER): Payer: Medicare Other

## 2015-04-22 VITALS — BP 124/86 | HR 69 | Resp 12

## 2015-04-22 DIAGNOSIS — S92912D Unspecified fracture of left toe(s), subsequent encounter for fracture with routine healing: Secondary | ICD-10-CM

## 2015-04-22 DIAGNOSIS — M205X1 Other deformities of toe(s) (acquired), right foot: Secondary | ICD-10-CM | POA: Diagnosis not present

## 2015-04-22 DIAGNOSIS — Z9889 Other specified postprocedural states: Secondary | ICD-10-CM

## 2015-04-22 NOTE — Progress Notes (Signed)
She presents today for a second postop visit regarding her Jake Michaelis arthroplasty with a single silicone implant right foot. She states that she seems to be doing very well with this and her left foot seems to be getting better to from the fracture of the proximal phalanx just prior to [place] for forming surgery on it.  Objective: Vital signs are stable she is alert and oriented 3. Pulses are palpable bilateral lower extremity and she has a great range of motion of the first metatarsophalangeal joint of the right foot. Sutures were removed margins remain well coapted no signs of infection. Radiographs evaluating the bilateral foot demonstrates well-healing surgical foot right and well-healing fracture hallux left.  Assessment: Well-healing surgical foot right well-healing fracture hallux left.  Plan: At this point I encouraged range of motion exercises for the right foot. She is to continue the use of the Darco shoe was provided at her last visit for both feet. She's already consented for her left foot and I will follow-up with her in 2 weeks just prior to going to surgery. We will perform a Keller arthroplasty with single silicone implant left foot March 10. I will follow-up with her on March 9 for reevaluation and a double set of x-rays.

## 2015-04-23 ENCOUNTER — Telehealth: Payer: Self-pay | Admitting: *Deleted

## 2015-04-23 NOTE — Telephone Encounter (Signed)
I called and left Caren Griffins at [facility] a message.  Patient's surgery scheduled for 05/07/2015 has been authorized by [facility].  Authorization number is [id number].

## 2015-05-06 ENCOUNTER — Ambulatory Visit (INDEPENDENT_AMBULATORY_CARE_PROVIDER_SITE_OTHER): Payer: Medicare Other

## 2015-05-06 ENCOUNTER — Other Ambulatory Visit: Payer: Self-pay | Admitting: Podiatry

## 2015-05-06 ENCOUNTER — Encounter: Payer: Self-pay | Admitting: Podiatry

## 2015-05-06 ENCOUNTER — Ambulatory Visit (INDEPENDENT_AMBULATORY_CARE_PROVIDER_SITE_OTHER): Payer: Medicare Other | Admitting: Podiatry

## 2015-05-06 VITALS — BP 156/89 | HR 77 | Resp 16

## 2015-05-06 DIAGNOSIS — M205X1 Other deformities of toe(s) (acquired), right foot: Secondary | ICD-10-CM | POA: Diagnosis not present

## 2015-05-06 DIAGNOSIS — Z9889 Other specified postprocedural states: Secondary | ICD-10-CM

## 2015-05-06 DIAGNOSIS — S92912D Unspecified fracture of left toe(s), subsequent encounter for fracture with routine healing: Secondary | ICD-10-CM

## 2015-05-06 MED ORDER — CLINDAMYCIN HCL 150 MG PO CAPS: 150.0000 mg | ORAL_CAPSULE | Freq: Three times a day (TID) | ORAL | Status: DC

## 2015-05-06 MED ORDER — OXYCODONE-ACETAMINOPHEN 10-325 MG PO TABS: ORAL_TABLET | ORAL | Status: DC

## 2015-05-06 MED ORDER — PROMETHAZINE HCL 25 MG PO TABS: 25.0000 mg | ORAL_TABLET | Freq: Three times a day (TID) | ORAL | Status: DC | PRN

## 2015-05-07 ENCOUNTER — Encounter: Payer: Self-pay | Admitting: *Deleted

## 2015-05-07 DIAGNOSIS — M2012 Hallux valgus (acquired), left foot: Secondary | ICD-10-CM | POA: Diagnosis not present

## 2015-05-07 NOTE — Progress Notes (Signed)
Patient ID: Karen Giles, female   DOB: [birth year], 65 y.o.   MRN: [id number] Dr Milinda Pointer performed a Vilinda Blanks Implant B/L on 04/02/15 at [facility]

## 2015-05-09 NOTE — Progress Notes (Signed)
She presents today for follow-up postop visit date of surgery third of February 2017. She states is doing very well as she refers to the first metatarsophalangeal joint of the right foot. The left foot is doing much better she says from a fracture to the hallux.  Objective: Vital signs are stable she is alert and oriented 3 she has no pain on palpation of the first metatarsal phalangeal joint or of the proximal phalanx. Right foot does give [place] a great range of motion well-healed surgical foot with great range of motion.  Assessment: Well-healing surgical foot right foot well-healing fracture proximal phalanx left.  Plan: I will follow up with her tomorrow and surgery for her left foot Keller arthroplasty with single silicone implant.

## 2015-05-10 ENCOUNTER — Telehealth: Payer: Self-pay

## 2015-05-10 NOTE — Telephone Encounter (Signed)
Spoke with pt regarding post operative status, she stated that she felt well, managing her pain effectively and denied fever, chill, and nausea. She is to call our office with any status changes

## 2015-05-11 ENCOUNTER — Telehealth: Payer: Self-pay | Admitting: *Deleted

## 2015-05-11 NOTE — Progress Notes (Signed)
Surgery performed at [facility] left foot by Dr. Milinda Pointer.

## 2015-05-11 NOTE — Telephone Encounter (Signed)
I'm calling to see how you are doing and to see how things went with your surgery.  "Everything went well.  I can't complain."  Have you been elevating your foot as much as possible and how's the pain medication working for you?  "I've been staying off it and keeping it elevated like he told me to.  Pain medicine works just fine."  Have you noticed any pain, swelling and redness in your calf?  Have you had a fever of 101 degrees?  "I haven't noticed any of those symptoms in my leg of course I been wearing the boot.  I think the swelling has gone down."  If notice any of those symptoms please give [place] a call.  You have a follow-up appointment on Thursday.  We'll see you then.  "Thanks for calling."

## 2015-05-13 ENCOUNTER — Ambulatory Visit (INDEPENDENT_AMBULATORY_CARE_PROVIDER_SITE_OTHER): Payer: Medicare Other

## 2015-05-13 ENCOUNTER — Ambulatory Visit (INDEPENDENT_AMBULATORY_CARE_PROVIDER_SITE_OTHER): Payer: Medicare Other | Admitting: Podiatry

## 2015-05-13 ENCOUNTER — Encounter: Payer: Self-pay | Admitting: Podiatry

## 2015-05-13 VITALS — BP 133/90 | HR 74 | Resp 12

## 2015-05-13 DIAGNOSIS — M205X9 Other deformities of toe(s) (acquired), unspecified foot: Secondary | ICD-10-CM

## 2015-05-13 DIAGNOSIS — Z9889 Other specified postprocedural states: Secondary | ICD-10-CM

## 2015-05-13 NOTE — Progress Notes (Signed)
She presents today 1 week status post Keller arthroplasty single silicone implant left foot. She states it is doing just great having had any problems with it and very little pain.  Objective: Vital signs are stable alert and oriented 3. Pulses are palpable. She has great range of motion of the first metatarsophalangeal joint erythema cellulitis drainage or odor. Radiographs confirm well-placed Jake Michaelis with implant and grommets.  Assessment: Well-healing surgical foot.  Plan: Discussed etiology pathology conservative versus surgical therapies. Redress today with a compressive dressing encouraged range of motion exercises Fletcher signs and symptoms of infection these were explained to her. Follow up with me in 1 week

## 2015-05-20 ENCOUNTER — Encounter: Payer: Self-pay | Admitting: Podiatry

## 2015-05-20 ENCOUNTER — Ambulatory Visit (INDEPENDENT_AMBULATORY_CARE_PROVIDER_SITE_OTHER): Payer: Medicare Other | Admitting: Podiatry

## 2015-05-20 VITALS — BP 130/83 | HR 79 | Resp 12

## 2015-05-20 DIAGNOSIS — Z9889 Other specified postprocedural states: Secondary | ICD-10-CM

## 2015-05-20 DIAGNOSIS — M205X2 Other deformities of toe(s) (acquired), left foot: Secondary | ICD-10-CM

## 2015-05-21 NOTE — Progress Notes (Signed)
She presents today for a follow-up of her Jake Michaelis arthroplasty and single silicone implant with grommets first metatarsophalangeal joint left foot date of surgery 05/07/2015. She denies fever chills nausea vomiting muscle aches and pains. States the foot is doing just wonderfully.  Objective: Vital signs are stable alert and oriented 3. Remarkably there is minimal edema no erythema cellulitis drainage or odor. Sutures are intact margins are well coapted she has a great range of motion passive and active first metatarsophalangeal joint left.  Assessment: Well-healing surgical foot first metatarsophalangeal joint left. Her right foot which was performed proximally 6 weeks ago is doing much better and she is wearing a regular shoe.  Plan: I would allow her to get back into her regular tennis shoe and I will follow-up with her in the next few weeks.

## 2015-06-03 ENCOUNTER — Ambulatory Visit (INDEPENDENT_AMBULATORY_CARE_PROVIDER_SITE_OTHER): Payer: Medicare Other

## 2015-06-03 ENCOUNTER — Ambulatory Visit (INDEPENDENT_AMBULATORY_CARE_PROVIDER_SITE_OTHER): Payer: Medicare Other | Admitting: Podiatry

## 2015-06-03 ENCOUNTER — Encounter: Payer: Self-pay | Admitting: Podiatry

## 2015-06-03 VITALS — BP 132/82 | HR 74 | Resp 12

## 2015-06-03 DIAGNOSIS — M205X2 Other deformities of toe(s) (acquired), left foot: Secondary | ICD-10-CM

## 2015-06-03 DIAGNOSIS — Z9889 Other specified postprocedural states: Secondary | ICD-10-CM | POA: Diagnosis not present

## 2015-06-03 NOTE — Progress Notes (Signed)
She presents today with one-month status post Keller arthroplasty with a single silicone implant left foot. She presents walking in her regular shoes and states that is doing fine is just a little more so within the left one. She states that it swells a little bit more but nothing horrible.  Objective: Vital signs are stable she is alert and oriented 3 pulses are strongly palpable. She has great range of motion of the first metatarsophalangeal joint with some tenderness on range of motion. Radiograph demonstrates well-placed Keller arthroplasty with single silicone implant.  Assessment: Well-healing surgical foot one-month status post Keller arthroplasty with single silicone implant with grommets left.  Plan: Follow up with me in 1 month. Back to her regular activity.

## 2015-06-17 ENCOUNTER — Ambulatory Visit (INDEPENDENT_AMBULATORY_CARE_PROVIDER_SITE_OTHER): Payer: Medicare Other | Admitting: Podiatry

## 2015-06-17 ENCOUNTER — Ambulatory Visit (INDEPENDENT_AMBULATORY_CARE_PROVIDER_SITE_OTHER): Payer: Medicare Other

## 2015-06-17 ENCOUNTER — Encounter: Payer: Self-pay | Admitting: Podiatry

## 2015-06-17 VITALS — BP 115/73 | HR 81 | Resp 16

## 2015-06-17 DIAGNOSIS — Z9889 Other specified postprocedural states: Secondary | ICD-10-CM | POA: Diagnosis not present

## 2015-06-17 DIAGNOSIS — S92912A Unspecified fracture of left toe(s), initial encounter for closed fracture: Secondary | ICD-10-CM

## 2015-06-17 DIAGNOSIS — M205X2 Other deformities of toe(s) (acquired), left foot: Secondary | ICD-10-CM

## 2015-06-17 MED ORDER — MELOXICAM 15 MG PO TABS: 15.0000 mg | ORAL_TABLET | Freq: Every day | ORAL | Status: DC

## 2015-06-19 NOTE — Progress Notes (Signed)
She presents today for follow-up of her Jake Michaelis arthroplasty of the single silicone implant left foot. She states that it was doing just great and now staying red and swollen and the big toes even a little purple looking. She denies any direct trauma though feels that she may have been on it too much.  Objective: Vital signs are stable she is alert and oriented 3. Pulses are palpable. Moderate edema and mild erythema some ecchymosis to the hallux. There is cellulitis drainage or odor. This is the hallux that we had to wait to heal before we could perform a Keller arthroplasty because there was a fracture midshaft. She has pain today to the plantar medial aspect of the proximal phalanx. Radiographs taken today 3 views right foot does demonstrate a small splinter of bone laterally which does not appear on previous radiographs suggestive of a mild trauma or possible refracture due to surgery.  Assessment: Fracture proximal phalanx hallux right status post Keller arthroplasty single silicone implant right foot.  Plan: Encouraged her to wear her Darco shoe for the next couple of weeks I will follow-up with her before she goes on her cruise.

## 2015-07-01 ENCOUNTER — Encounter: Payer: Self-pay | Admitting: Podiatry

## 2015-07-01 ENCOUNTER — Ambulatory Visit (INDEPENDENT_AMBULATORY_CARE_PROVIDER_SITE_OTHER): Payer: Medicare Other | Admitting: Podiatry

## 2015-07-01 ENCOUNTER — Ambulatory Visit (INDEPENDENT_AMBULATORY_CARE_PROVIDER_SITE_OTHER): Payer: Medicare Other

## 2015-07-01 VITALS — BP 116/82 | HR 60 | Resp 16

## 2015-07-01 DIAGNOSIS — M205X2 Other deformities of toe(s) (acquired), left foot: Secondary | ICD-10-CM

## 2015-07-01 DIAGNOSIS — Z9889 Other specified postprocedural states: Secondary | ICD-10-CM | POA: Diagnosis not present

## 2015-07-03 NOTE — Progress Notes (Signed)
She presents today for follow-up of her Jake Michaelis arthroplasty with single silicone implant left foot where she had actually cracked the corner off the base of the proximal phalanx. She states that now that she's been in the boot for a while the toe feels so much better and the range of motion is much improved and is a lot less painful.  Objective: Vital signs are stable alert and oriented 3. Much decrease in edema to the first metatarsophalangeal joint left. Radiographs confirm well-healing fracture the corner of the base of the proximal phalanx left hallux. No signs of infection or bony breakdown. Implant appears to be in good position.  Assessment: Well-healing fracture hallux left.  Plan: I would allow her to get back to her regular routine follow-up with me as needed.

## 2015-07-08 ENCOUNTER — Other Ambulatory Visit: Payer: Self-pay

## 2015-07-08 DIAGNOSIS — Z1231 Encounter for screening mammogram for malignant neoplasm of breast: Secondary | ICD-10-CM

## 2015-07-16 ENCOUNTER — Encounter: Payer: Self-pay | Admitting: Podiatry

## 2015-08-24 ENCOUNTER — Ambulatory Visit
Admission: RE | Admit: 2015-08-24 | Discharge: 2015-08-24 | Disposition: A | Discharge status: Home / Self Care | Payer: Medicare Other | Source: Ambulatory Visit

## 2015-08-24 DIAGNOSIS — Z1231 Encounter for screening mammogram for malignant neoplasm of breast: Secondary | ICD-10-CM

## 2015-09-07 ENCOUNTER — Encounter (HOSPITAL_COMMUNITY): Payer: Medicare Other

## 2015-09-07 ENCOUNTER — Encounter: Payer: Medicare Other | Admitting: Vascular Surgery

## 2015-09-23 ENCOUNTER — Encounter: Payer: Self-pay | Admitting: Obstetrics and Gynecology

## 2015-09-23 ENCOUNTER — Ambulatory Visit (INDEPENDENT_AMBULATORY_CARE_PROVIDER_SITE_OTHER): Payer: Medicare Other | Admitting: Obstetrics and Gynecology

## 2015-09-23 VITALS — BP 120/80 | HR 60 | Resp 12 | Ht 65.0 in | Wt 145.4 lb

## 2015-09-23 DIAGNOSIS — N952 Postmenopausal atrophic vaginitis: Secondary | ICD-10-CM

## 2015-09-23 DIAGNOSIS — M8580 Other specified disorders of bone density and structure, unspecified site: Secondary | ICD-10-CM

## 2015-09-23 DIAGNOSIS — Z01419 Encounter for gynecological examination (general) (routine) without abnormal findings: Secondary | ICD-10-CM | POA: Diagnosis not present

## 2015-09-23 MED ORDER — ESTRADIOL 10 MCG VA TABS: 1.0000 | ORAL_TABLET | VAGINAL | 3 refills | Status: DC

## 2015-09-23 NOTE — Patient Instructions (Signed)

## 2015-09-23 NOTE — Progress Notes (Signed)
65 y.o. G1P1001 MarriedCaucasianF here for annual exam.  No bleeding. She has some dryness with intercourse, no pain. She has had surgery on her feet in 2/17 and 3/17. Doing well.     Patient's last menstrual period was 02/27/1993.          Sexually active: Yes.    The current method of family planning is post menopausal status.    Exercising: Yes.    cardio, weights Smoker:  no  Health Maintenance: Pap: 08/19/2013 WNL Neg HR HPV History of abnormal Pap:  no MMG:  08/24/15 BIRADS1 Colonoscopy: 2015 per patient. Polyp removed. Repeat 5 years.  BMD: 05/13/2012 Osteopenia TDaP: 2013   reports that she has never smoked. She has never used smokeless tobacco. She reports that she drinks about 1.2 oz of alcohol per week . She reports that she does not use drugs.Her son lives in [place] with his family. He is 8 and has 2 young sons.   Past Medical History:  Diagnosis Date  . GERD (gastroesophageal reflux disease)   . H. pylori infection 02/2012   treated with pylera  . HTN (hypertension)     Past Surgical History:  Procedure Laterality Date  . COLONOSCOPY    08/10/2003   RMR: Normal rectum and colon  . ESOPHAGOGASTRODUODENOSCOPY    08/10/2003   RMR: Possible cervical esophageal web status post dilation/ Otherwise normal esophagus, stomach, and first and second portions of the duodenum  . ESOPHAGOGASTRODUODENOSCOPY (EGD) WITH ESOPHAGEAL DILATION  03/07/2012   Procedure: ESOPHAGOGASTRODUODENOSCOPY (EGD) WITH ESOPHAGEAL DILATION;  Surgeon: Daneil Dolin, MD;  Location: [facility];  Service: Endoscopy;  Laterality: N/A;  8:30  . TOE SURGERY  February & March 0000000   Silicone implants  Foot surgery  Current Outpatient Prescriptions  Medication Sig Dispense Refill  . chlorthalidone (HYGROTON) 25 MG tablet Take 12.5 mg by mouth daily.     . Estradiol 10 MCG TABS vaginal tablet Place 1 tablet (10 mcg total) vaginally 2 (two) times a week. 24 tablet 3   No current facility-administered  medications for this visit.     Family History  Problem Relation Age of Onset  . Heart failure Mother   . Cancer Maternal Grandmother   . Cancer Maternal Grandfather   . Stroke Paternal Grandmother   . Heart failure Paternal Grandmother   . Cancer Paternal Grandfather   . Colon cancer Neg Hx     Review of Systems  Constitutional: Negative.   HENT: Negative.   Eyes: Negative.   Respiratory: Negative.   Cardiovascular: Negative.   Gastrointestinal: Negative.   Endocrine: Negative.   Genitourinary: Negative.   Musculoskeletal: Negative.   Skin: Negative.   Allergic/Immunologic: Negative.   Neurological: Negative.   Hematological: Negative.   Psychiatric/Behavioral: Negative.     Exam:   BP 120/80 (BP Location: Right Arm, Patient Position: Sitting, Cuff Size: Normal)   Pulse 60   Resp 12   Ht 5\' 5"  (1.651 m)   Wt 145 lb 6.4 oz (66 kg)   LMP 02/27/1993   BMI 24.20 kg/m   Weight change: @WEIGHTCHANGE @ Height:   Height: 5\' 5"  (165.1 cm)  Ht Readings from Last 3 Encounters:  09/23/15 5\' 5"  (1.651 m)  09/17/14 5' 5.75" (1.67 m)  08/19/13 5' 5.5" (1.664 m)    General appearance: alert, cooperative and appears stated age Head: Normocephalic, without obvious abnormality, atraumatic Neck: no adenopathy, supple, symmetrical, trachea midline and thyroid normal to inspection and palpation Lungs: clear to auscultation  bilaterally Breasts: normal appearance, no masses or tenderness Heart: regular rate and rhythm Abdomen: soft, non-tender; bowel sounds normal; no masses,  no organomegaly Extremities: extremities normal, atraumatic, no cyanosis or edema Skin: Skin color, texture, turgor normal. No rashes or lesions Lymph nodes: Cervical, supraclavicular, and axillary nodes normal. No abnormal inguinal nodes palpated Neurologic: Grossly normal   Pelvic: External genitalia:  no lesions              Urethra:  normal appearing urethra with no masses, tenderness or lesions               Bartholins and Skenes: normal                 Vagina: atrophic appearing vagina with normal color and discharge, no lesions              Cervix: no lesions               Bimanual Exam:  Uterus:  normal size, contour, position, consistency, mobility, non-tender              Adnexa: no mass, fullness, tenderness               Rectovaginal: Confirms               Anus:  normal sphincter tone, no lesions  Chaperone was present for exam.  A:  Well Woman with normal exam  Osteopenia  Vaginal atrophy  P:   No pap this year  DEXA ordered, # given  Discussed calcium and vit D  Use a lubricant with intercourse  Discussed vaginal estrogen, she would like to try the generic vaginal tablet  Call with any concerns

## 2015-10-12 ENCOUNTER — Encounter: Payer: Self-pay | Admitting: Vascular Surgery

## 2015-10-13 ENCOUNTER — Other Ambulatory Visit: Payer: Self-pay | Admitting: Vascular Surgery

## 2015-10-13 DIAGNOSIS — I872 Venous insufficiency (chronic) (peripheral): Secondary | ICD-10-CM

## 2015-10-13 DIAGNOSIS — R609 Edema, unspecified: Secondary | ICD-10-CM

## 2015-10-14 ENCOUNTER — Ambulatory Visit (INDEPENDENT_AMBULATORY_CARE_PROVIDER_SITE_OTHER): Payer: Medicare Other | Admitting: Vascular Surgery

## 2015-10-14 ENCOUNTER — Encounter: Payer: Self-pay | Admitting: Vascular Surgery

## 2015-10-14 ENCOUNTER — Ambulatory Visit (HOSPITAL_COMMUNITY)
Admission: RE | Admit: 2015-10-14 | Discharge: 2015-10-14 | Disposition: A | Discharge status: Home / Self Care | Payer: Medicare Other | Source: Ambulatory Visit | Attending: Vascular Surgery | Admitting: Vascular Surgery

## 2015-10-14 DIAGNOSIS — R609 Edema, unspecified: Secondary | ICD-10-CM

## 2015-10-14 DIAGNOSIS — I872 Venous insufficiency (chronic) (peripheral): Secondary | ICD-10-CM

## 2015-10-14 DIAGNOSIS — I83892 Varicose veins of left lower extremities with other complications: Secondary | ICD-10-CM | POA: Insufficient documentation

## 2015-10-14 DIAGNOSIS — I83893 Varicose veins of bilateral lower extremities with other complications: Secondary | ICD-10-CM

## 2015-10-14 NOTE — Progress Notes (Addendum)
Referred by:  Asencion Noble, MD [address] [place], [place] [address]  Reason for referral: Left leg swelling   History of Present Illness  Karen Giles is a 65 y.o. ([birth year]) female s/p prior RLE GSV EVLA and sclerotherapy who presents with chief complaint: recent left>right leg swelling.  Patient has known history of CVI, having previously undergone EVLA of R GSV an unknown number of years ago.  The patient notes periodically bilateral calf swelling which resolves with compression stocking use.  She noted after a long plane trip swelling in both legs with outbreak of "red dots on both feet during this episode."   The patient has had no history of DVT, known history of pregnancy, known history of varicose vein, no history of venous stasis ulcers, no history of  Lymphedema and known history of skin changes in lower legs.  There is no family history of venous disorders.  The patient has has used compression stockings in the past.   Past Medical History:  Diagnosis Date  . GERD (gastroesophageal reflux disease)   . H. pylori infection 02/2012   treated with pylera  . HTN (hypertension)     Past Surgical History:  Procedure Laterality Date  . COLONOSCOPY    08/10/2003   RMR: Normal rectum and colon  . ESOPHAGOGASTRODUODENOSCOPY    08/10/2003   RMR: Possible cervical esophageal web status post dilation/ Otherwise normal esophagus, stomach, and first and second portions of the duodenum  . ESOPHAGOGASTRODUODENOSCOPY (EGD) WITH ESOPHAGEAL DILATION  03/07/2012   Procedure: ESOPHAGOGASTRODUODENOSCOPY (EGD) WITH ESOPHAGEAL DILATION;  Surgeon: Daneil Dolin, MD;  Location: [facility];  Service: Endoscopy;  Laterality: N/A;  8:30  . TOE SURGERY  February & March 0000000   Silicone implants    Social History   Social History  . Marital status: Married    Spouse name: N/A  . Number of children: 1  . Years of education: N/A   Occupational History  . retired,  Cytogeneticist school    Social History Main Topics  . Smoking status: Never Smoker  . Smokeless tobacco: Never Used  . Alcohol use 1.2 oz/week    2 Glasses of wine per week     Comment: socailly  . Drug use: No  . Sexual activity: Yes    Birth control/ protection: Post-menopausal   Other Topics Concern  . Not on file   Social History Narrative   LIves w/ husband    Family History  Problem Relation Age of Onset  . Heart failure Mother   . Cancer Maternal Grandmother   . Cancer Maternal Grandfather   . Stroke Paternal Grandmother   . Heart failure Paternal Grandmother   . Cancer Paternal Grandfather   . Colon cancer Neg Hx     Current Outpatient Prescriptions  Medication Sig Dispense Refill  . chlorthalidone (HYGROTON) 25 MG tablet Take 12.5 mg by mouth daily.     . Estradiol 10 MCG TABS vaginal tablet Place 1 tablet (10 mcg total) vaginally 2 (two) times a week. 24 tablet 3   No current facility-administered medications for this visit.     Allergies  Allergen Reactions  . Penicillins Rash     REVIEW OF SYSTEMS:  (Positives checked otherwise negative)  CARDIOVASCULAR:   [ ]  chest pain,  [ ]  chest pressure,  [ ]  palpitations,  [ ]  shortness of breath when laying flat,  [ ]  shortness of breath with exertion,   [ ]   pain in feet when walking,  [ ]  pain in feet when laying flat, [ ]  history of blood clot in veins (DVT),  [ ]  history of phlebitis,  [x]  swelling in legs,  [x]  varicose veins  PULMONARY:   [ ]  productive cough,  [ ]  asthma,  [ ]  wheezing  NEUROLOGIC:   [ ]  weakness in arms or legs,  [ ]  numbness in arms or legs,  [ ]  difficulty speaking or slurred speech,  [ ]  temporary loss of vision in one eye,  [ ]  dizziness  HEMATOLOGIC:   [ ]  bleeding problems,  [ ]  problems with blood clotting too easily  MUSCULOSKEL:   [ ]  joint pain, [ ]  joint swelling  GASTROINTEST:   [ ]  vomiting blood,  [ ]  blood in stool      GENITOURINARY:   [ ]  burning with urination,  [ ]  blood in urine  PSYCHIATRIC:   [ ]  history of major depression  INTEGUMENTARY:   [ ]  rashes,  [ ]  ulcers  CONSTITUTIONAL:   [ ]  fever,  [ ]  chills   Physical Examination  Vitals:   10/14/15 1556  BP: 131/80  Pulse: 87  Resp: 14  Temp: 98.2 F (36.8 C)  SpO2: 98%  Weight: 141 lb (64 kg)  Height: 5' 5.5" (1.664 m)   Body mass index is 23.11 kg/m.  General: A&O x 3, WD, thin  Head: Gearhart/AT  Ear/Nose/Throat: Hearing grossly intact, nares without erythema or drainage, oropharynx without Erythema/Exudate, Mallampati score: 3  Eyes: PERRLA, EOMI  Neck: Supple, no nuchal rigidity, no palpable LAD  Pulmonary: Sym exp, good air movt, CTAB, no rales, rhonchi, & wheezing  Cardiac: RRR, Nl S1, S2, no Murmurs, rubs or gallops  Vascular: Vessel Right Left  Radial Palpable Palpable  Brachial Palpable Palpable  Carotid Palpable, without bruit Palpable, without bruit  Aorta Not palpable N/A  Femoral Palpable Palpable  Popliteal Not palpable Not palpable  PT Palpable Palpable  DP Palpable Palpable   Gastrointestinal: soft, NTND, no G/R, no HSM, no masses, no CVAT B   Musculoskeletal: M/S 5/5 throughout , Extremities without ischemic changes , no LDS, no edema, extensive spider veins in both thighs, healed incision overlying R and L 1st MT  Neurologic: CN 2-12 intact , Pain and light touch intact in extremities , Motor exam as listed above  Psychiatric: Judgment intact, Mood & affect appropriate for pt's clinical situation  Dermatologic: See M/S exam for extremity exam, no rashes otherwise noted  Lymph : No Cervical, Axillary, or Inguinal lymphadenopathy    Non-Invasive Vascular Imaging  BLE Venous Insufficiency Duplex (Date: 10/14/2015):   RLE:   No DVT and SVT,   No GSV visualized  No SSV visualized  No deep venous reflux  LLE:  No DVT and SVT,   + GSV reflux: 4.5 mm - 6.4 mm  no SSV  reflux,  + deep venous reflux: SFJ   Outside Studies/Documentation 2 pages of outside documents were reviewed including: outpatient PCP chart.   Medical Decision Making  Karen Giles is a 65 y.o. female who presents with: BLE chronic venous insufficiency (C2), varicose veins with complications, s/p R GSV EVLA   Based on the patient's history and examination, I recommend: compressive therapy.  I discussed with the patient the use of her 20-30 mm thigh high compression stockings and need for 3 month trial of such.  The patient will follow up in 3 months with my partners in the  [facility] for evaluation for: L GSV EVLA.  Thank you for allowing [place] to participate in this patient's care.   Adele Barthel, MD [facility] Office: [phone] Pager: [phone]  10/14/2015, 4:15 PM

## 2015-10-25 ENCOUNTER — Ambulatory Visit
Admission: RE | Admit: 2015-10-25 | Discharge: 2015-10-25 | Disposition: A | Discharge status: Home / Self Care | Payer: Medicare Other | Source: Ambulatory Visit | Attending: Obstetrics and Gynecology | Admitting: Obstetrics and Gynecology

## 2015-10-25 ENCOUNTER — Other Ambulatory Visit: Payer: Medicare Other

## 2015-10-25 DIAGNOSIS — M8580 Other specified disorders of bone density and structure, unspecified site: Secondary | ICD-10-CM

## 2015-11-08 ENCOUNTER — Telehealth: Payer: Self-pay | Admitting: Obstetrics and Gynecology

## 2015-11-08 NOTE — Telephone Encounter (Signed)
Patient calling for bone density results she had done in August.

## 2015-11-08 NOTE — Telephone Encounter (Signed)
Returned call to patient. Advised patient we were in touch with radiology to calculate fracture risk and our office would be in touch as soon as we got the final results. Patient verbalized understanding.   Routing to provider for final review. Patient agreeable to disposition. Will close encounter.

## 2015-11-10 NOTE — Telephone Encounter (Signed)
I called radiology again today, explained that the patient is only on vaginal estrogen. Requested FRAX calculation.

## 2016-01-18 ENCOUNTER — Ambulatory Visit: Payer: Medicare Other | Admitting: Vascular Surgery

## 2016-01-26 ENCOUNTER — Encounter: Payer: Self-pay | Admitting: Vascular Surgery

## 2016-02-01 ENCOUNTER — Encounter: Payer: Self-pay | Admitting: Vascular Surgery

## 2016-02-01 ENCOUNTER — Ambulatory Visit (INDEPENDENT_AMBULATORY_CARE_PROVIDER_SITE_OTHER): Payer: Medicare Other | Admitting: Vascular Surgery

## 2016-02-01 ENCOUNTER — Other Ambulatory Visit: Payer: Self-pay | Admitting: *Deleted

## 2016-02-01 VITALS — BP 122/79 | HR 74 | Temp 97.6°F | Resp 16 | Ht 65.5 in | Wt 139.0 lb

## 2016-02-01 DIAGNOSIS — I83892 Varicose veins of left lower extremities with other complications: Secondary | ICD-10-CM

## 2016-02-01 DIAGNOSIS — I83812 Varicose veins of left lower extremities with pain: Secondary | ICD-10-CM

## 2016-02-01 NOTE — Progress Notes (Signed)
Subjective:     Patient ID: Karen Giles, female   DOB: 04-13-1950, 65 y.o.   MRN: [id number]  HPI This 65 year old female returns for further evaluation regarding her pain and swelling left greater than right lower extremity. She was evaluated by Dr. Geryl Councilman 3 months ago. She has a remote history of laser ablation in the right great saphenous vein and also had large varicosities treated and has done well from that standpoint. Over the past few years she has developed worsening edema which progresses during the day. She has also developed increasing aching discomfort in the left leg in particular. This has not been improved by long leg elastic compression stockings 20-30 millimeter gradient or elevation and ibuprofen. This is affecting her daily living. Her swelling is particularly bad on flights and following flights.  Past Medical History:  Diagnosis Date  . GERD (gastroesophageal reflux disease)   . H. pylori infection 02/2012   treated with pylera  . HTN (hypertension)     Social History  Substance Use Topics  . Smoking status: Never Smoker  . Smokeless tobacco: Never Used  . Alcohol use 1.2 oz/week    2 Glasses of wine per week     Comment: socailly    Family History  Problem Relation Age of Onset  . Heart failure Mother   . Cancer Maternal Grandmother   . Cancer Maternal Grandfather   . Stroke Paternal Grandmother   . Heart failure Paternal Grandmother   . Cancer Paternal Grandfather   . Colon cancer Neg Hx     Allergies  Allergen Reactions  . Penicillins Rash     Current Outpatient Prescriptions:  .  chlorthalidone (HYGROTON) 25 MG tablet, Take 12.5 mg by mouth daily. , Disp: , Rfl:  .  Estradiol 10 MCG TABS vaginal tablet, Place 1 tablet (10 mcg total) vaginally 2 (two) times a week., Disp: 24 tablet, Rfl: 3 .  LORazepam (ATIVAN) 0.5 MG tablet, , Disp: , Rfl:   Vitals:   02/01/16 1029  BP: 122/79  Pulse: 74  Resp: 16  Temp: 97.6 F (36.4 C)  SpO2: 96%   Weight: 139 lb (63 kg)  Height: 5' 5.5" (1.664 m)    Body mass index is 22.78 kg/m.         Review of Systems Denies chest pain, dyspnea on exertion, PND, orthopnea, hemoptysis    Objective:   Physical Exam BP 122/79 (BP Location: Left Arm, Patient Position: Sitting, Cuff Size: Normal)   Pulse 74   Temp 97.6 F (36.4 C)   Resp 16   Ht 5' 5.5" (1.664 m)   Wt 139 lb (63 kg)   LMP 02/27/1993   SpO2 96%   BMI 22.78 kg/m   Gen. well-developed well-nourished female no apparent stress alert and oriented 3 Lungs no rhonchi or wheezing Left leg with diffuse spider veins and small reticular veins and medial and lateral thigh. 1+ edema distally. No hyperpigmentation or ulceration noted.  Previous venous ultrasound revealed gross reflux in the left great saphenous vein with an enlarged vein Today I performed a independent sono site ultrasound exam which confirmed gross reflux in the left great saphenous vein down to the distal thigh level.    Assessment:     Pain and swelling left leg-CEAP-3 due to gross reflux left great saphenous vein Symptoms are resistant to conservative measures including long-leg elastic compression stockings 20-30 millimeter gradient, elevation, and ibuprofen and are affecting patient's daily living (   Plan:  Would recommend laser ablation left great saphenous vein and then have patient return in 3 months to be evaluated for possible sclerotherapy depending on the results We will proceed with precertification to perform this in the near future

## 2016-03-28 ENCOUNTER — Other Ambulatory Visit: Payer: Medicare Other | Admitting: Vascular Surgery

## 2016-04-04 ENCOUNTER — Encounter (HOSPITAL_COMMUNITY): Payer: Medicare Other

## 2016-04-04 ENCOUNTER — Ambulatory Visit: Payer: Medicare Other | Admitting: Vascular Surgery

## 2016-04-10 ENCOUNTER — Encounter: Payer: Self-pay | Admitting: Vascular Surgery

## 2016-04-18 ENCOUNTER — Encounter: Payer: Self-pay | Admitting: Vascular Surgery

## 2016-04-18 ENCOUNTER — Ambulatory Visit (INDEPENDENT_AMBULATORY_CARE_PROVIDER_SITE_OTHER): Payer: Medicare Other | Admitting: Vascular Surgery

## 2016-04-18 VITALS — BP 133/78 | HR 69 | Temp 97.5°F | Resp 18 | Ht 65.5 in | Wt 142.6 lb

## 2016-04-18 DIAGNOSIS — I83892 Varicose veins of left lower extremities with other complications: Secondary | ICD-10-CM | POA: Diagnosis not present

## 2016-04-18 HISTORY — PX: ENDOVENOUS ABLATION SAPHENOUS VEIN W/ LASER: SUR449

## 2016-04-18 NOTE — Progress Notes (Signed)
Varicose veins left with complication Subjective:     Patient ID: DEANNDRA COOPRIDER, female   DOB: 24-Apr-1950, 66 y.o.   MRN: [id number]  HPI This 66 year old female had laser ablation of the left great saphenous vein performed under local tumescent anesthesia. A total of 972 J of energy was utilized. She tolerated the procedure well.  Review of Systems     Objective:   Physical Exam BP 133/78 (BP Location: Left Arm, Patient Position: Sitting, Cuff Size: Normal)   Pulse 69   Temp 97.5 F (36.4 C) (Oral)   Resp 18   Ht 5' 5.5" (1.664 m)   Wt 142 lb 9.6 oz (64.7 kg)   LMP 02/27/1993   SpO2 98%   BMI 23.37 kg/m        Assessment:     Well-tolerated laser ablation left great saphenous vein performed under local tumescent anesthesia    Plan:     Return in 1 week for venous duplex exam to confirm closure left great saphenous vein Patient will then return in 3 months to be evaluated for possible sclerotherapy

## 2016-04-18 NOTE — Progress Notes (Signed)
Laser Ablation Procedure    Date: 04/18/2016   Karen Giles DOB:1950-06-08  Consent signed: Yes    Surgeon:  Dr. Nelda Severe. Kellie Simmering  Procedure: Laser Ablation: left Greater Saphenous Vein  BP 133/78 (BP Location: Left Arm, Patient Position: Sitting, Cuff Size: Normal)   Pulse 69   Temp 97.5 F (36.4 C) (Oral)   Resp 18   Ht 5' 5.5" (1.664 m)   Wt 142 lb 9.6 oz (64.7 kg)   LMP 02/27/1993   SpO2 98%   BMI 23.37 kg/m   Tumescent Anesthesia: 300 cc 0.9% NaCl with 50 cc Lidocaine HCL with 1% Epi and 15 cc 8.4% NaHCO3  Local Anesthesia: 4 cc Lidocaine HCL and NaHCO3 (ratio 2:1)  Pulsed Mode: 15 watts, 514ms delay, 1.0 duration  Total Energy:  972 Joules            Total Pulses:  65              Total Time: 1:04      Patient tolerated procedure well    Description of Procedure:  After marking the course of the secondary varicosities, the patient was placed on the operating table in the supine position, and the left leg was prepped and draped in sterile fashion.   Local anesthetic was administered and under ultrasound guidance the saphenous vein was accessed with a micro needle and guide wire; then the mirco puncture sheath was placed.  A guide wire was inserted saphenofemoral junction , followed by a 5 french sheath.  The position of the sheath and then the laser fiber below the junction was confirmed using the ultrasound.  Tumescent anesthesia was administered along the course of the saphenous vein using ultrasound guidance. The patient was placed in Trendelenburg position and protective laser glasses were placed on patient and staff, and the laser was fired at 15 watts continuous mode advancing 1-35mm/second for a total of 972 joules.      Steri strip was applied at IV insertion site and ABD pads and thigh high compression stockings were applied.  Ace wrap bandages were applied  at the top of the saphenofemoral junction. Blood loss was less than 15 cc.  The patient ambulated out  of the operating room having tolerated the procedure well.

## 2016-04-18 NOTE — Progress Notes (Signed)
Subjective:     Patient ID: Karen Giles, female   DOB: [birth year], 67 y.o.   MRN: [id number]  HPI   Review of Systems     Objective:   Physical Exam     Assessment:         Plan:

## 2016-04-19 ENCOUNTER — Encounter: Payer: Self-pay | Admitting: Vascular Surgery

## 2016-04-24 ENCOUNTER — Encounter: Payer: Self-pay | Admitting: Vascular Surgery

## 2016-04-24 ENCOUNTER — Ambulatory Visit (INDEPENDENT_AMBULATORY_CARE_PROVIDER_SITE_OTHER): Payer: Medicare Other | Admitting: Vascular Surgery

## 2016-04-24 ENCOUNTER — Ambulatory Visit (HOSPITAL_COMMUNITY)
Admission: RE | Admit: 2016-04-24 | Discharge: 2016-04-24 | Disposition: A | Discharge status: Home / Self Care | Payer: Medicare Other | Source: Ambulatory Visit | Attending: Vascular Surgery | Admitting: Vascular Surgery

## 2016-04-24 VITALS — BP 137/81 | HR 68 | Temp 97.8°F | Resp 18 | Ht 65.5 in | Wt 143.5 lb

## 2016-04-24 DIAGNOSIS — I83812 Varicose veins of left lower extremities with pain: Secondary | ICD-10-CM

## 2016-04-24 DIAGNOSIS — I82812 Embolism and thrombosis of superficial veins of left lower extremities: Secondary | ICD-10-CM | POA: Diagnosis not present

## 2016-04-24 DIAGNOSIS — I83892 Varicose veins of left lower extremities with other complications: Secondary | ICD-10-CM | POA: Diagnosis not present

## 2016-04-24 NOTE — Progress Notes (Signed)
Subjective:     Patient ID: Karen Giles, female   DOB: 1950-07-05, 66 y.o.   MRN: [id number]  HPI 37 66 year old female returns 1 week post-laser ablation left great saphenous vein for gross reflux with pain and swelling. She had mild discomfort for 24 hours but is had no pain since that time. She has had some moderate bruising. She has worn her elastic compression stockings but only take the ibuprofen one day and then discontinue. She has no specific complaints. She denies distal edema  Past Medical History:  Diagnosis Date  . GERD (gastroesophageal reflux disease)   . H. pylori infection 02/2012   treated with pylera  . HTN (hypertension)     Social History  Substance Use Topics  . Smoking status: Never Smoker  . Smokeless tobacco: Never Used  . Alcohol use 1.2 oz/week    2 Glasses of wine per week     Comment: socailly    Family History  Problem Relation Age of Onset  . Heart failure Mother   . Cancer Maternal Grandmother   . Cancer Maternal Grandfather   . Stroke Paternal Grandmother   . Heart failure Paternal Grandmother   . Cancer Paternal Grandfather   . Colon cancer Neg Hx     Allergies  Allergen Reactions  . Penicillins Rash     Current Outpatient Prescriptions:  .  chlorthalidone (HYGROTON) 25 MG tablet, Take 12.5 mg by mouth daily. , Disp: , Rfl:  .  Estradiol 10 MCG TABS vaginal tablet, Place 1 tablet (10 mcg total) vaginally 2 (two) times a week., Disp: 24 tablet, Rfl: 3 .  LORazepam (ATIVAN) 0.5 MG tablet, , Disp: , Rfl:   Vitals:   04/24/16 1013  BP: 137/81  Pulse: 68  Resp: 18  Temp: 97.8 F (36.6 C)  TempSrc: Oral  Weight: 143 lb 8 oz (65.1 kg)  Height: 5' 5.5" (1.664 m)    Body mass index is 23.52 kg/m.         Review of Systems Past chest pain, dyspnea on exertion, PND, orthopnea, hemoptysis    Objective:   Physical Exam BP 137/81 (BP Location: Left Arm, Patient Position: Sitting, Cuff Size: Normal)   Pulse 68   Temp 97.8  F (36.6 C) (Oral)   Resp 18   Ht 5' 5.5" (1.664 m)   Wt 143 lb 8 oz (65.1 kg)   LMP 02/27/1993   BMI 23.52 kg/m   Gen. well-developed well-nourished female no apparent distress alert and oriented 3 Lungs no rhonchi or wheezing Left leg with mild to moderate ecchymosis along course of great saphenous vein in mid to proximal thigh. Mild tenderness to deep palpation over great saphenous vein. No distal edema noted. Network of reticular and spider veins remains in distal thigh and calf.  Today I ordered a venous duplex exam the left leg which I reviewed and interpreted. There is no DVT. There is total closure of the great saphenous vein on the left up to near the saphenofemoral junction     Assessment:     Successful laser ablation left great saphenous vein for pain and swelling with gross reflux left great saphenous system    Plan:     Return in 3 months for evaluation of possible foam sclerotherapy to complete her treatment regimen

## 2016-07-17 ENCOUNTER — Other Ambulatory Visit: Payer: Self-pay | Admitting: Obstetrics and Gynecology

## 2016-07-17 DIAGNOSIS — Z1231 Encounter for screening mammogram for malignant neoplasm of breast: Secondary | ICD-10-CM

## 2016-07-18 ENCOUNTER — Ambulatory Visit: Payer: Medicare Other | Admitting: Vascular Surgery

## 2016-08-28 ENCOUNTER — Encounter: Payer: Self-pay | Admitting: Vascular Surgery

## 2016-09-04 ENCOUNTER — Ambulatory Visit
Admission: RE | Admit: 2016-09-04 | Discharge: 2016-09-04 | Disposition: A | Discharge status: Home / Self Care | Payer: Medicare Other | Source: Ambulatory Visit | Attending: Obstetrics and Gynecology | Admitting: Obstetrics and Gynecology

## 2016-09-04 ENCOUNTER — Encounter: Payer: Self-pay | Admitting: Vascular Surgery

## 2016-09-04 ENCOUNTER — Ambulatory Visit (INDEPENDENT_AMBULATORY_CARE_PROVIDER_SITE_OTHER): Payer: Medicare Other | Admitting: Vascular Surgery

## 2016-09-04 VITALS — BP 135/82 | HR 70 | Temp 97.5°F | Resp 18 | Ht 65.5 in | Wt 144.8 lb

## 2016-09-04 DIAGNOSIS — I83892 Varicose veins of left lower extremities with other complications: Secondary | ICD-10-CM

## 2016-09-04 DIAGNOSIS — Z1231 Encounter for screening mammogram for malignant neoplasm of breast: Secondary | ICD-10-CM

## 2016-09-04 NOTE — Progress Notes (Signed)
Subjective:     Patient ID: Karen Giles, female   DOB: Apr 28, 1950, 66 y.o.   MRN: [id number]  HPI this 66 year old female returns 3 months post-laser ablation left great saphenous vein for pain and swelling in the left leg. She had successful ablation left great saphenous system documented by ultrasound one week post procedure. She continues to have some stinging discomfort in the reticular and spider veins in the medial and lateral thigh area on the left. She also has been having bilateral foot pain and is scheduled to see an orthopedic surgeon in the near future regarding this. She's had no distal edema.  Past Medical History:  Diagnosis Date  . GERD (gastroesophageal reflux disease)   . H. pylori infection 02/2012   treated with pylera  . HTN (hypertension)     Social History  Substance Use Topics  . Smoking status: Never Smoker  . Smokeless tobacco: Never Used  . Alcohol use 1.2 oz/week    2 Glasses of wine per week     Comment: socailly    Family History  Problem Relation Age of Onset  . Heart failure Mother   . Cancer Maternal Grandmother   . Cancer Maternal Grandfather   . Stroke Paternal Grandmother   . Heart failure Paternal Grandmother   . Cancer Paternal Grandfather   . Colon cancer Neg Hx     Allergies  Allergen Reactions  . Penicillins Rash     Current Outpatient Prescriptions:  .  chlorthalidone (HYGROTON) 25 MG tablet, Take 12.5 mg by mouth daily. , Disp: , Rfl:  .  Estradiol 10 MCG TABS vaginal tablet, Place 1 tablet (10 mcg total) vaginally 2 (two) times a week. (Patient not taking: Reported on 09/04/2016), Disp: 24 tablet, Rfl: 3 .  LORazepam (ATIVAN) 0.5 MG tablet, , Disp: , Rfl:   Vitals:   09/04/16 1023  BP: 135/82  Pulse: 70  Resp: 18  Temp: (!) 97.5 F (36.4 C)  TempSrc: Oral  SpO2: 100%  Weight: 144 lb 12.8 oz (65.7 kg)  Height: 5' 5.5" (1.664 m)    Body mass index is 23.73 kg/m.        Review of Systems Denies chest pain,  dyspnea on exertion, PND, orthopnea, hemoptysis, claudication-see history of present illness regarding bilateral foot pain    Objective:   Physical Exam BP 135/82 (BP Location: Left Arm, Patient Position: Sitting, Cuff Size: Normal)   Pulse 70   Temp (!) 97.5 F (36.4 C) (Oral)   Resp 18   Ht 5' 5.5" (1.664 m)   Wt 144 lb 12.8 oz (65.7 kg)   LMP 02/27/1993   SpO2 100%   BMI 23.73 kg/m   Gen. well-developed well-nourished female no apparent distress alert and oriented 3 Lungs no rhonchi or wheezing Left leg with diffuse pattern of spider and reticular veins medial and lateral thigh. No distal edema noted in left ankle. 3+ dorsalis pedis pulse palpable. Left foot well perfused.     Assessment:     Successful laser ablation left great saphenous vein with residual painful spider and reticular veins medial and lateral thigh    Plan:     Patient needs to units of sclerotherapy to eliminate spider and reticular veins medial and lateral thigh and hopefully relieve these symptoms She will follow-up with her orthopedic surgeon regarding bilateral foot pain We will proceed with precertification for sclerotherapy

## 2016-09-26 ENCOUNTER — Encounter: Payer: Self-pay | Admitting: Podiatry

## 2016-09-26 ENCOUNTER — Ambulatory Visit: Payer: Medicare Other

## 2016-09-26 ENCOUNTER — Ambulatory Visit (INDEPENDENT_AMBULATORY_CARE_PROVIDER_SITE_OTHER): Payer: Medicare Other

## 2016-09-26 ENCOUNTER — Ambulatory Visit (INDEPENDENT_AMBULATORY_CARE_PROVIDER_SITE_OTHER): Payer: Medicare Other | Admitting: Podiatry

## 2016-09-26 DIAGNOSIS — M205X2 Other deformities of toe(s) (acquired), left foot: Secondary | ICD-10-CM

## 2016-09-26 DIAGNOSIS — M7752 Other enthesopathy of left foot: Secondary | ICD-10-CM

## 2016-09-27 NOTE — Progress Notes (Signed)
She presents today with chief complaint of pain to the lateral aspect of the left foot times past several weeks. She states that seems to be getting better or improving to some degree. She is in recall any trauma. States that she continues to exercise that over the last week or so she has not exercised and the foot has felt much better.  Objective: Vital signs are stable she is alert and oriented 3. No reproducible pain on evaluation today pulses remain palpable. Radiographs do not demonstrate anything other than soft tissue edema at the insertion site of the peroneus brevis on the fifth metatarsal of the left foot. No open lesions or wounds are noted.  Assessment: Probable resolving tendinitis of her left foot.  Plan: I recommended that she continue to decrease her activities for the next few weeks until the foot feels much better and change shoe year when she is back to exercising. Should this not resolve she will notify us.

## 2016-09-28 ENCOUNTER — Ambulatory Visit (INDEPENDENT_AMBULATORY_CARE_PROVIDER_SITE_OTHER): Payer: Medicare Other | Admitting: Obstetrics and Gynecology

## 2016-09-28 ENCOUNTER — Other Ambulatory Visit (HOSPITAL_COMMUNITY)
Admission: RE | Admit: 2016-09-28 | Discharge: 2016-09-28 | Disposition: A | Discharge status: Home / Self Care | Payer: Medicare Other | Source: Ambulatory Visit | Attending: Obstetrics and Gynecology | Admitting: Obstetrics and Gynecology

## 2016-09-28 ENCOUNTER — Encounter: Payer: Self-pay | Admitting: Obstetrics and Gynecology

## 2016-09-28 VITALS — BP 130/80 | HR 64 | Resp 16 | Ht 65.0 in | Wt 143.0 lb

## 2016-09-28 DIAGNOSIS — M8580 Other specified disorders of bone density and structure, unspecified site: Secondary | ICD-10-CM | POA: Diagnosis not present

## 2016-09-28 DIAGNOSIS — N941 Unspecified dyspareunia: Secondary | ICD-10-CM | POA: Diagnosis not present

## 2016-09-28 DIAGNOSIS — N952 Postmenopausal atrophic vaginitis: Secondary | ICD-10-CM

## 2016-09-28 DIAGNOSIS — Z01419 Encounter for gynecological examination (general) (routine) without abnormal findings: Secondary | ICD-10-CM

## 2016-09-28 DIAGNOSIS — Z124 Encounter for screening for malignant neoplasm of cervix: Secondary | ICD-10-CM | POA: Diagnosis not present

## 2016-09-28 MED ORDER — NONFORMULARY OR COMPOUNDED ITEM: 1 refills | Status: DC

## 2016-09-28 NOTE — Addendum Note (Signed)
Addended by: Dorothy Spark on: 09/28/2016 05:20 PM   Modules accepted: Orders

## 2016-09-28 NOTE — Patient Instructions (Signed)

## 2016-09-28 NOTE — Progress Notes (Signed)
66 y.o. G1P1001 MarriedCaucasianF here for annual exam.  No vaginal bleeding. Some dryness with intercourse. Stopped the vagifem secondary to expense. Currently avoiding vaginal penetration secondary to pain. Would like to try a compounded vaginal cream if it is affordable.  She has urinary frequency, no change, on a diuretic. No leakage.     Patient's last menstrual period was 02/27/1993.          Sexually active: Yes.    The current method of family planning is post menopausal status.    Exercising: Yes.    aerobic and strength training  Smoker:  no  Health Maintenance: Pap:  08-19-13 WNL NEG HR HPV  History of abnormal Pap:  no MMG:  09-04-16 WNL  Colonoscopy:  2015 per patient. Polyp removed. Repeat 5 years BMD:   10-25-15 Osteopenia  TDaP:  2013  Gardasil:NA   reports that she has never smoked. She has never used smokeless tobacco. She reports that she drinks about 1.2 oz of alcohol per week . She reports that she does not use drugs. Retired. Her son and 2 grandsons live in [place]  Past Medical History:  Diagnosis Date  . GERD (gastroesophageal reflux disease)   . H. pylori infection 02/2012   treated with pylera  . HTN (hypertension)     Past Surgical History:  Procedure Laterality Date  . COLONOSCOPY    08/10/2003   RMR: Normal rectum and colon  . ENDOVENOUS ABLATION SAPHENOUS VEIN W/ LASER Left 04/18/2016   endovenous laser ablation left greater saphenous vein by Tinnie Gens MD   . ESOPHAGOGASTRODUODENOSCOPY    08/10/2003   RMR: Possible cervical esophageal web status post dilation/ Otherwise normal esophagus, stomach, and first and second portions of the duodenum  . ESOPHAGOGASTRODUODENOSCOPY (EGD) WITH ESOPHAGEAL DILATION  03/07/2012   Procedure: ESOPHAGOGASTRODUODENOSCOPY (EGD) WITH ESOPHAGEAL DILATION;  Surgeon: Daneil Dolin, MD;  Location: [facility];  Service: Endoscopy;  Laterality: N/A;  8:30  . TOE SURGERY  February & March 1610   Silicone implants     Current Outpatient Prescriptions  Medication Sig Dispense Refill  . chlorthalidone (HYGROTON) 25 MG tablet Take 12.5 mg by mouth daily.      No current facility-administered medications for this visit.     Family History  Problem Relation Age of Onset  . Heart failure Mother   . Cancer Maternal Grandmother   . Cancer Maternal Grandfather   . Stroke Paternal Grandmother   . Heart failure Paternal Grandmother   . Cancer Paternal Grandfather   . Colon cancer Neg Hx     Review of Systems  Constitutional: Negative.   HENT: Negative.   Eyes: Negative.   Respiratory: Negative.   Cardiovascular: Negative.   Gastrointestinal: Negative.   Endocrine: Negative.   Genitourinary: Negative.   Musculoskeletal: Negative.   Skin: Negative.   Allergic/Immunologic: Negative.   Neurological: Negative.   Psychiatric/Behavioral: Negative.     Exam:   BP 130/80 (BP Location: Right Arm, Patient Position: Sitting, Cuff Size: Normal)   Pulse 64   Resp 16   Ht 5\' 5"  (1.651 m)   Wt 143 lb (64.9 kg)   LMP 02/27/1993   BMI 23.80 kg/m   Weight change: @WEIGHTCHANGE @ Height:   Height: 5\' 5"  (165.1 cm)  Ht Readings from Last 3 Encounters:  09/28/16 5\' 5"  (1.651 m)  09/04/16 5' 5.5" (1.664 m)  04/24/16 5' 5.5" (1.664 m)    General appearance: alert, cooperative and appears stated age Head: Normocephalic, without obvious  abnormality, atraumatic Neck: no adenopathy, supple, symmetrical, trachea midline and thyroid normal to inspection and palpation Lungs: clear to auscultation bilaterally Cardiovascular: regular rate and rhythm Breasts: normal appearance, no masses or tenderness Abdomen: soft, non-tender; bowel sounds normal; no masses,  no organomegaly Extremities: extremities normal, atraumatic, no cyanosis or edema Skin: Skin color, texture, turgor normal. No rashes or lesions Lymph nodes: Cervical, supraclavicular, and axillary nodes normal. No abnormal inguinal nodes  palpated Neurologic: Grossly normal   Pelvic: External genitalia:  no lesions              Urethra:  normal appearing urethra with no masses, tenderness or lesions              Bartholins and Skenes: normal                 Vagina: normal appearing atrophic vagina with normal color and discharge, no lesions              Cervix: no lesions               Bimanual Exam:  Uterus:  normal size, contour, position, consistency, mobility, non-tender              Adnexa: no mass, fullness, tenderness               Rectovaginal: Confirms               Anus:  normal sphincter tone, no lesions  Chaperone was present for exam.  A:  Well Woman with normal exam  Vaginal atrophy  Dyspareunia  Osteopenia   P:   Pap with reflex hpv  Labs with primary MD  Mammogram and colonoscopy are UTD  Discussed breast self awareness  Discussed calcium and vit D intake  Will call the compounding pharmacy about vaginal estrogen options  Recommend she use lubrication with intercourse  DEXA in 9/19

## 2016-10-02 LAB — CYTOLOGY - PAP: DIAGNOSIS: NEGATIVE

## 2016-10-25 ENCOUNTER — Encounter: Payer: Self-pay | Admitting: *Deleted

## 2016-11-01 ENCOUNTER — Ambulatory Visit: Payer: Medicare Other | Admitting: *Deleted

## 2016-11-02 ENCOUNTER — Ambulatory Visit (INDEPENDENT_AMBULATORY_CARE_PROVIDER_SITE_OTHER): Payer: Medicare Other | Admitting: *Deleted

## 2016-11-02 DIAGNOSIS — I83893 Varicose veins of bilateral lower extremities with other complications: Secondary | ICD-10-CM | POA: Diagnosis not present

## 2016-11-02 NOTE — Progress Notes (Signed)
X=.3% Sotradecol administered with a 27g butterfly.  Patient received a total of 12cc.  Treated all areas of concern . Easy access. Tol well. She has one more ins covered tx which we scheduled. Will see her again in 6 weeks.    Compression stockings applied: Yes.

## 2016-11-10 ENCOUNTER — Encounter: Payer: Self-pay | Admitting: Vascular Surgery

## 2016-12-13 ENCOUNTER — Ambulatory Visit (INDEPENDENT_AMBULATORY_CARE_PROVIDER_SITE_OTHER): Payer: Medicare Other | Admitting: *Deleted

## 2016-12-13 DIAGNOSIS — I83893 Varicose veins of bilateral lower extremities with other complications: Secondary | ICD-10-CM

## 2016-12-13 NOTE — Progress Notes (Signed)
X=.3% Sotradecol administered with a 27g butterfly.  Patient received a total of 12cc.  Cleaned up any remaining open vessels. Easy access. Tol well. Anticipate good results. Follow prn.   Compression stockings applied: Yes.

## 2017-01-24 ENCOUNTER — Ambulatory Visit: Payer: Medicare Other | Admitting: *Deleted

## 2017-03-13 ENCOUNTER — Other Ambulatory Visit: Payer: Self-pay | Admitting: Obstetrics and Gynecology

## 2017-03-13 NOTE — Telephone Encounter (Signed)
Message left to return call to Pawel Soules at [phone].    

## 2017-03-13 NOTE — Telephone Encounter (Signed)
Patient returned call. States she does not need refill on estradiol vaginal tablets as she is now using compounded cream.

## 2017-05-29 ENCOUNTER — Ambulatory Visit (INDEPENDENT_AMBULATORY_CARE_PROVIDER_SITE_OTHER): Payer: Medicare Other

## 2017-05-29 ENCOUNTER — Ambulatory Visit: Payer: Medicare Other | Admitting: Podiatry

## 2017-05-29 ENCOUNTER — Encounter: Payer: Self-pay | Admitting: Podiatry

## 2017-05-29 DIAGNOSIS — M7751 Other enthesopathy of right foot: Secondary | ICD-10-CM

## 2017-05-29 DIAGNOSIS — M779 Enthesopathy, unspecified: Secondary | ICD-10-CM | POA: Diagnosis not present

## 2017-05-29 NOTE — Progress Notes (Signed)
She presents today with a chief complaint of a painful right ankle.  States that it was a sudden onset after exercising.  She is denies any injury to it she took an Advil and that seemed to make it better but is really tender right here she points to the posterior medial malleolar region.  Objective: Vital signs are stable she is alert and oriented x3.  Pulses are palpable.  She has pain to palpation with mild edema of the deep tendons as they course posterior to the medial malleolus.  Radiographs do not demonstrate any type of osseous abnormalities in this area.  Assessment: Tendinitis right ankle.  Plan: I injected the area today with 20 mg Kenalog 5 mg Marcaine after sterile Betadine skin prep.  She tolerated procedure well without complications I will follow-up with her in a couple of weeks.  She will call sooner if needed.  Provided her with both oral and written home-going instructions for tendinitis.

## 2017-06-07 ENCOUNTER — Ambulatory Visit (INDEPENDENT_AMBULATORY_CARE_PROVIDER_SITE_OTHER): Payer: Medicare Other | Admitting: Otolaryngology

## 2017-06-07 DIAGNOSIS — H93293 Other abnormal auditory perceptions, bilateral: Secondary | ICD-10-CM | POA: Diagnosis not present

## 2017-06-07 DIAGNOSIS — H61111 Acquired deformity of pinna, right ear: Secondary | ICD-10-CM | POA: Diagnosis not present

## 2017-06-07 DIAGNOSIS — R42 Dizziness and giddiness: Secondary | ICD-10-CM

## 2017-07-16 ENCOUNTER — Ambulatory Visit: Payer: Medicare Other | Admitting: Cardiovascular Disease

## 2017-07-16 ENCOUNTER — Encounter: Payer: Self-pay | Admitting: Cardiovascular Disease

## 2017-07-16 VITALS — BP 146/84 | HR 73 | Ht 65.0 in | Wt 146.6 lb

## 2017-07-16 DIAGNOSIS — R42 Dizziness and giddiness: Secondary | ICD-10-CM | POA: Diagnosis not present

## 2017-07-16 DIAGNOSIS — R2689 Other abnormalities of gait and mobility: Secondary | ICD-10-CM | POA: Diagnosis not present

## 2017-07-16 DIAGNOSIS — I10 Essential (primary) hypertension: Secondary | ICD-10-CM

## 2017-07-16 DIAGNOSIS — R5383 Other fatigue: Secondary | ICD-10-CM | POA: Diagnosis not present

## 2017-07-16 NOTE — Patient Instructions (Addendum)
Your physician wants you to follow-up in:as needed with [place]     Your physician recommends that you continue on your current medications as directed. Please refer to the Current Medication list given to you today.   If you need a refill on your cardiac medications before your next appointment, please call your pharmacy.     You have been referred to [facility] in [place], they will call you to schedule apt     Get TSH and B12 labs today      Thank you for choosing [facility] !

## 2017-07-16 NOTE — Addendum Note (Signed)
Addended by: Barbarann Ehlers A on: 07/16/2017 09:29 AM   Modules accepted: Orders

## 2017-07-16 NOTE — Progress Notes (Signed)
CARDIOLOGY CONSULT NOTE  Patient ID: Karen Giles MRN: [id number] DOB/AGE: 1950-07-25 67 y.o.  Admit date: (Not on file) Primary Physician: Asencion Noble, MD Referring Physician: Asencion Noble, MD  Reason for Consultation: Dizziness  HPI: Karen Giles is a 67 y.o. female who is being seen today for the evaluation of dizziness at the request of Asencion Noble, MD.   She was evaluated by ENT on 06/07/2017 (Dr. Benjamine Mola).  I reviewed his notes.  Audiometric testing demonstrated normal hearing bilaterally across all frequencies.  The tympanogram was normal bilaterally.  It was felt that her initial episode of dizziness was suggestive of transient benign paroxysmal positional vertigo.  Residual imbalance was felt to be likely central in origin and possibly secondary to cardiovascular causes.  Past medical history includes hypertension and GERD.  She has also previously undergone laser ablation of the left great saphenous vein for pain and swelling in the left leg.  She denies exertional chest pain, leg swelling, and exertional dyspnea.  She has had sporadic palpitations over the years but has not experienced any in the past 3 years.  She denies syncope, orthopnea, and paroxysmal nocturnal dyspnea.  She said she feels "wobbly "in the morning.  She denies a history of falls.  She just feels off balance at times.  She denies a spinning sensation of the room.  She exercises on a daily basis.  She does not do aerobics regularly because she gets overheated.  She feels very fatigued.  Symptoms somehow improved with exercise.  She has no history of smoking.  She and her husband sleeps separately as she wakes up frequently throughout the night and has to urinate.  She takes chlorthalidone at night.  Family history: Mother died of CHF at the age of 56.  She was a heavy smoker.   Allergies  Allergen Reactions  . Penicillins Rash    Current Outpatient Medications  Medication Sig Dispense Refill    . chlorthalidone (HYGROTON) 25 MG tablet Take 12.5 mg by mouth daily.     Marland Kitchen estradiol (ESTRACE) 0.1 MG/GM vaginal cream Estradiol 0.1% cream, 1 gram vaginally qhs x 1 week, then 2 x a week at hs    . famotidine (PEPCID) 10 MG tablet Take by mouth.     No current facility-administered medications for this visit.     Past Medical History:  Diagnosis Date  . GERD (gastroesophageal reflux disease)   . H. pylori infection 02/2012   treated with pylera  . HTN (hypertension)     Past Surgical History:  Procedure Laterality Date  . COLONOSCOPY    08/10/2003   RMR: Normal rectum and colon  . ENDOVENOUS ABLATION SAPHENOUS VEIN W/ LASER Left 04/18/2016   endovenous laser ablation left greater saphenous vein by Tinnie Gens MD   . ESOPHAGOGASTRODUODENOSCOPY    08/10/2003   RMR: Possible cervical esophageal web status post dilation/ Otherwise normal esophagus, stomach, and first and second portions of the duodenum  . ESOPHAGOGASTRODUODENOSCOPY (EGD) WITH ESOPHAGEAL DILATION  03/07/2012   Procedure: ESOPHAGOGASTRODUODENOSCOPY (EGD) WITH ESOPHAGEAL DILATION;  Surgeon: Daneil Dolin, MD;  Location: [facility];  Service: Endoscopy;  Laterality: N/A;  8:30  . TOE SURGERY  February & March 7371   Silicone implants    Social History   Socioeconomic History  . Marital status: Married    Spouse name: Not on file  . Number of children: 1  . Years of education: Not on file  .  Highest education level: Not on file  Occupational History  . Occupation: retired, [employer] school  Social Needs  . Financial resource strain: Not on file  . Food insecurity:    Worry: Not on file    Inability: Not on file  . Transportation needs:    Medical: Not on file    Non-medical: Not on file  Tobacco Use  . Smoking status: Never Smoker  . Smokeless tobacco: Never Used  Substance and Sexual Activity  . Alcohol use: Yes    Alcohol/week: 1.2 oz    Types: 2 Glasses of wine per week     Comment: socailly  . Drug use: No  . Sexual activity: Yes    Birth control/protection: Post-menopausal  Lifestyle  . Physical activity:    Days per week: Not on file    Minutes per session: Not on file  . Stress: Not on file  Relationships  . Social connections:    Talks on phone: Not on file    Gets together: Not on file    Attends religious service: Not on file    Active member of club or organization: Not on file    Attends meetings of clubs or organizations: Not on file    Relationship status: Not on file  . Intimate partner violence:    Fear of current or ex partner: Not on file    Emotionally abused: Not on file    Physically abused: Not on file    Forced sexual activity: Not on file  Other Topics Concern  . Not on file  Social History Narrative   LIves w/ husband      Current Meds  Medication Sig  . chlorthalidone (HYGROTON) 25 MG tablet Take 12.5 mg by mouth daily.   Marland Kitchen estradiol (ESTRACE) 0.1 MG/GM vaginal cream Estradiol 0.1% cream, 1 gram vaginally qhs x 1 week, then 2 x a week at hs  . famotidine (PEPCID) 10 MG tablet Take by mouth.      Review of systems complete and found to be negative unless listed above in HPI    Physical exam Height 5\' 5"  (1.651 m), weight 146 lb 9.6 oz (66.5 kg), last menstrual period 02/27/1993. General: NAD Neck: No JVD, no thyromegaly or thyroid nodule.  Lungs: Clear to auscultation bilaterally with normal respiratory effort. CV: Nondisplaced PMI. Regular rate and rhythm, normal S1/S2, no S3/S4, no murmur.  No peripheral edema.  No carotid bruit.   Abdomen: Soft, nontender, no distention.  Skin: Intact without lesions or rashes.  Neurologic: Alert and oriented x 3.  Psych: Normal affect. Extremities: No clubbing or cyanosis.  HEENT: Normal.   ECG: Most recent ECG reviewed.   Labs: No results found for: K, BUN, CREATININE, ALT, TSH, HGB   Lipids: No results found for: LDLCALC, LDLDIRECT, CHOL, TRIG, HDL       ASSESSMENT AND PLAN:   1.  Dizziness and loss of balance: She does not express typical features of benign paroxysmal positional vertigo.  She has no history of falls.  There is no history of multiple sclerosis in the family history.  She had an uncle with Parkinson's disease.  She does not have memory loss.  This does not appear to be cardiac in etiology. I will obtain a TSH and vitamin B12 level.  She likely needs neuro imaging.  I will make a neurology referral.  2.  Fatigue: She has no witnessed apneic episodes but does sleep by herself.  I would consider  sleep disordered breathing and the differential diagnosis.  She does wake up frequently at night to urinate.  I instructed her to take chlorthalidone in the mornings.  3.  Hypertension: Blood pressure is probably elevated.  Due to frequent nocturia, I recommended she take chlorthalidone in the morning.    Disposition: Follow up as needed   Signed: Kate Sable, M.D., F.A.C.C.  07/16/2017, 8:41 AM

## 2017-07-18 ENCOUNTER — Other Ambulatory Visit: Payer: Self-pay | Admitting: Cardiovascular Disease

## 2017-07-19 LAB — VITAMIN B12: Vitamin B-12: 417 pg/mL (ref 232–1245)

## 2017-07-19 LAB — TSH: TSH: 4.52 u[IU]/mL — AB (ref 0.450–4.500)

## 2017-08-13 ENCOUNTER — Ambulatory Visit (INDEPENDENT_AMBULATORY_CARE_PROVIDER_SITE_OTHER): Payer: Medicare Other | Admitting: Otolaryngology

## 2017-08-13 DIAGNOSIS — D2321 Other benign neoplasm of skin of right ear and external auricular canal: Secondary | ICD-10-CM

## 2017-08-13 DIAGNOSIS — D2320 Other benign neoplasm of skin of unspecified ear and external auricular canal: Secondary | ICD-10-CM

## 2017-09-11 ENCOUNTER — Ambulatory Visit: Payer: Medicare Other | Admitting: Neurology

## 2017-10-11 NOTE — Progress Notes (Signed)
68 y.o. G1P1001 MarriedCaucasianF here for annual exam.   She is having some issues with balance and muscle weakness and is being evaluated by a Neurologist. Symptoms started a long time ago and are progressing slowly.  She is using a compounded vaginal estrogen 2 x a week and it is helping a lot. No longer having pain with intercourse.  No vaginal bleeding.     Patient's last menstrual period was 02/27/1993.          Sexually active: Yes.    The current method of family planning is post menopausal status.    Exercising: Yes.    weights, power flex, walking Smoker:  no  Health Maintenance: Pap:  09/28/2016 normal History of abnormal Pap:  no MMG:  09/04/2016 BI-RADS CATEGORY  1: Negative Colonoscopy:  2015 Polyps, due next year BMD:   10/25/2015 osteopenia TDaP:  Up to date per patient Gardasil: N/A   reports that she has never smoked. She has never used smokeless tobacco. She reports that she drinks about 2.0 standard drinks of alcohol per week. She reports that she does not use drugs. Retired. Her son and 2 grandsons live in [place]  Past Medical History:  Diagnosis Date  . GERD (gastroesophageal reflux disease)   . H. pylori infection 02/2012   treated with pylera  . HTN (hypertension)     Past Surgical History:  Procedure Laterality Date  . COLONOSCOPY    08/10/2003   RMR: Normal rectum and colon  . ENDOVENOUS ABLATION SAPHENOUS VEIN W/ LASER Left 04/18/2016   endovenous laser ablation left greater saphenous vein by Tinnie Gens MD   . ESOPHAGOGASTRODUODENOSCOPY    08/10/2003   RMR: Possible cervical esophageal web status post dilation/ Otherwise normal esophagus, stomach, and first and second portions of the duodenum  . ESOPHAGOGASTRODUODENOSCOPY (EGD) WITH ESOPHAGEAL DILATION  03/07/2012   Procedure: ESOPHAGOGASTRODUODENOSCOPY (EGD) WITH ESOPHAGEAL DILATION;  Surgeon: Daneil Dolin, MD;  Location: [facility];  Service: Endoscopy;  Laterality: N/A;  8:30  . TOE SURGERY   February & March 4098   Silicone implants    Current Outpatient Medications  Medication Sig Dispense Refill  . chlorthalidone (HYGROTON) 25 MG tablet Take 12.5 mg by mouth daily.     Marland Kitchen estradiol (ESTRACE) 0.1 MG/GM vaginal cream Estradiol 0.1% cream, 1 gram vaginally qhs x 1 week, then 2 x a week at hs    . famotidine (PEPCID) 10 MG tablet Take by mouth as needed.      No current facility-administered medications for this visit.     Family History  Problem Relation Age of Onset  . Heart failure Mother   . Cancer Maternal Grandmother   . Cancer Maternal Grandfather   . Stroke Paternal Grandmother   . Heart failure Paternal Grandmother   . Cancer Paternal Grandfather   . Colon cancer Neg Hx     Review of Systems  Constitutional: Negative.   HENT: Negative.   Eyes: Negative.   Respiratory: Negative.   Cardiovascular: Negative.   Gastrointestinal: Negative.   Endocrine: Negative.   Genitourinary: Negative.   Musculoskeletal: Negative.   Skin: Negative.   Allergic/Immunologic: Negative.   Neurological: Negative.   Hematological: Negative.   Psychiatric/Behavioral: Negative.     Exam:   BP 124/82 (BP Location: Right Arm, Patient Position: Sitting, Cuff Size: Normal)   Pulse 68   Ht 5' 5.35" (1.66 m)   Wt 144 lb (65.3 kg)   LMP 02/27/1993   BMI 23.70 kg/m  Weight change: @WEIGHTCHANGE @ Height:   Height: 5' 5.35" (166 cm)  Ht Readings from Last 3 Encounters:  10/18/17 5' 5.35" (1.66 m)  10/16/17 5\' 5"  (1.651 m)  07/16/17 5\' 5"  (1.651 m)    General appearance: alert, cooperative and appears stated age Head: Normocephalic, without obvious abnormality, atraumatic Neck: no adenopathy, supple, symmetrical, trachea midline and thyroid normal to inspection and palpation Lungs: clear to auscultation bilaterally Cardiovascular: regular rate and rhythm Breasts: normal appearance, no masses or tenderness Abdomen: soft, non-tender; non distended,  no masses,  no  organomegaly Extremities: extremities normal, atraumatic, no cyanosis or edema Skin: Skin color, texture, turgor normal. No rashes or lesions Lymph nodes: Cervical, supraclavicular, and axillary nodes normal. No abnormal inguinal nodes palpated Neurologic: Grossly normal   Pelvic: External genitalia:  no lesions              Urethra:  normal appearing urethra with no masses, tenderness or lesions              Bartholins and Skenes: normal                 Vagina: normal appearing vagina with normal color and discharge, no lesions              Cervix: no lesions               Bimanual Exam:  Uterus:  normal size, contour, position, consistency, mobility, non-tender              Adnexa: no mass, fullness, tenderness               Rectovaginal: Confirms               Anus:  normal sphincter tone, no lesions  Chaperone was present for exam.  A:  Well Woman with normal exam  Osteopenia  Vaginal atrophy, helped with compounded vaginal estrogen  P:   No pap   Mammogram due  DEXA due  Labs with primary  Colonoscopy  Discussed breast self exam  Discussed calcium and vit D intake

## 2017-10-16 ENCOUNTER — Other Ambulatory Visit: Payer: Self-pay

## 2017-10-16 ENCOUNTER — Ambulatory Visit: Payer: Medicare Other | Admitting: Neurology

## 2017-10-16 ENCOUNTER — Encounter: Payer: Self-pay | Admitting: Neurology

## 2017-10-16 ENCOUNTER — Telehealth: Payer: Self-pay | Admitting: Neurology

## 2017-10-16 VITALS — BP 138/82 | HR 70 | Ht 65.0 in | Wt 142.5 lb

## 2017-10-16 DIAGNOSIS — R42 Dizziness and giddiness: Secondary | ICD-10-CM

## 2017-10-16 DIAGNOSIS — M6281 Muscle weakness (generalized): Secondary | ICD-10-CM | POA: Diagnosis not present

## 2017-10-16 DIAGNOSIS — G3281 Cerebellar ataxia in diseases classified elsewhere: Secondary | ICD-10-CM | POA: Diagnosis not present

## 2017-10-16 NOTE — Telephone Encounter (Signed)
[employer] Medicare order sent to GI. No auth they will reach out to the pt to schedule.  °

## 2017-10-16 NOTE — Progress Notes (Signed)
Reason for visit: Muscle weakness, dizziness  Referring physician: Dr. [name] is a 67 y.o. female  History of present illness:  Karen Giles is a 67 year old right-handed white female with a history of what appears to be 2 separate problems.  The first issue is a problem with weakness and balance problems that began in the 1990s and has gradually worsened over the last several decades.  The patient is having increasing problems with fatigue, she tries to exercise on a regular basis but she feels that she may be losing ground.  The patient mainly reports weakness of the legs, she has difficulty climbing stairs or going up an incline.  She will fall on occasion.  She has not noticed any progressive worsening of arm weakness but she claims that her arms have always been weak throughout her life.  She has recently noted that when she is lying down flat on the ground she has difficulty lifting her head up off the ground and she has to assist her head by pulling up with her arms.  The patient does have some swallowing problems but she claims that this is related to an esophageal stricture, she has had dilation of this previously.  The patient denies any neck pain or low back pain, she denies significant problems with headaches.  She denies droopiness of the eyelids or double vision problems.  She denies any family history of similar issues of weakness.  When going upstairs, over the last 5 years she has noted she has to pull herself up on the banister, she cannot walk up a flight of stairs normally.  Within the last several months the patient has noted some sensory alteration in the feet that is most noticeable at night with a burning sensation.  During the daytime, she does not notice any significant issues, she denies any numbness or discomfort in the hands.  A vitamin B12 level has been done recently that was unremarkable, TSH was slightly elevated.  The second issue is an event  that occurred in November 2018.  The patient had sudden onset of some dizziness and woozy sensations without true vertigo.  The patient had no nausea or vomiting or headache.  The symptoms lasted about 3 or 4 months and gradually cleared.  The patient was seen by Dr. Benjamine Mola from ENT on 07 June 2017, evaluation appear to be relatively unremarkable.  The cause of the dizziness was never determined, the patient never underwent MRI evaluation of the brain.   Past Medical History:  Diagnosis Date  . GERD (gastroesophageal reflux disease)   . H. pylori infection 02/2012   treated with pylera  . HTN (hypertension)     Past Surgical History:  Procedure Laterality Date  . COLONOSCOPY    08/10/2003   RMR: Normal rectum and colon  . ENDOVENOUS ABLATION SAPHENOUS VEIN W/ LASER Left 04/18/2016   endovenous laser ablation left greater saphenous vein by Tinnie Gens MD   . ESOPHAGOGASTRODUODENOSCOPY    08/10/2003   RMR: Possible cervical esophageal web status post dilation/ Otherwise normal esophagus, stomach, and first and second portions of the duodenum  . ESOPHAGOGASTRODUODENOSCOPY (EGD) WITH ESOPHAGEAL DILATION  03/07/2012   Procedure: ESOPHAGOGASTRODUODENOSCOPY (EGD) WITH ESOPHAGEAL DILATION;  Surgeon: Daneil Dolin, MD;  [facility]: [facility];  Service: Endoscopy;  Laterality: N/A;  8:30  . TOE SURGERY  February & March 4401   Silicone implants    Family History  Problem Relation Age of Onset  .  Heart failure Mother   . Cancer Maternal Grandmother   . Cancer Maternal Grandfather   . Stroke Paternal Grandmother   . Heart failure Paternal Grandmother   . Cancer Paternal Grandfather   . Colon cancer Neg Hx     Social history:  reports that she has never smoked. She has never used smokeless tobacco. She reports that she drinks about 2.0 standard drinks of alcohol per week. She reports that she does not use drugs.  Medications:  Prior to Admission medications   Medication Sig Start Date End  Date Taking? Authorizing Provider  chlorthalidone (HYGROTON) 25 MG tablet Take 12.5 mg by mouth daily.  03/01/12  Yes [provider]  estradiol (ESTRACE) 0.1 MG/GM vaginal cream Estradiol 0.1% cream, 1 gram vaginally qhs x 1 week, then 2 x a week at hs 09/28/16  Yes [provider]  famotidine (PEPCID) 10 MG tablet Take by mouth as needed.  03/28/17  Yes [provider]      Allergies  Allergen Reactions  . Penicillins Rash    ROS:  Out of a complete 14 system review of symptoms, the patient complains only of the following symptoms, and all other reviewed systems are negative.  Difficulty swallowing Aching muscles Weakness, dizziness Anxiety, decreased energy  Blood pressure 138/82, pulse 70, height 5\' 5"  (1.651 m), weight 142 lb 8 oz (64.6 kg), last menstrual period 02/27/1993, SpO2 98 %.  Physical Exam  General: The patient is alert and cooperative at the time of the examination.  Eyes: Pupils are equal, round, and reactive to light. Discs are flat bilaterally.  Neck: The neck is supple, no carotid bruits are noted.  Respiratory: The respiratory examination is clear.  Cardiovascular: The cardiovascular examination reveals a regular rate and rhythm, no obvious murmurs or rubs are noted.  Skin: Extremities are without significant edema.  Neurologic Exam  Mental status: The patient is alert and oriented x 3 at the time of the examination. The patient has apparent normal recent and remote memory, with an apparently normal attention span and concentration ability.  Cranial nerves: Facial symmetry is present. There is good sensation of the face to pinprick and soft touch bilaterally. The strength of the muscles with eye closure appear to be slightly weak, the patient has weakness with neck flexion, fairly normal with neck extension. Speech is well enunciated, no aphasia or dysarthria is noted. Extraocular movements are full. Visual fields are full. The  tongue is midline, and the patient has symmetric elevation of the soft palate. No obvious hearing deficits are noted.  Motor: The motor testing reveals 5 over 5 strength of the lower extremities with exception of 4/5 strength with hip flexion bilaterally.  The patient has fairly good strength with intrinsic muscles of the hands but otherwise has 4+/5 strength throughout the arms with flexion extension at the elbow and external rotation of the arms and with the deltoid muscle strength.  The weakness pattern is symmetric. Good symmetric motor tone is noted throughout.  Sensory: Sensory testing is intact to pinprick, soft touch, vibration sensation, and position sense on all 4 extremities, with exception of a stocking pattern pinprick sensory deficit across the ankles bilaterally. No evidence of extinction is noted.  Coordination: Cerebellar testing reveals good finger-nose-finger and heel-to-shin bilaterally.  Gait and station: Gait is normal. Tandem gait is normal. Romberg is negative. No drift is seen.  The patient is able to walk on the heels and the toes bilaterally.  Reflexes: Deep tendon reflexes  are symmetric, but are depressed bilaterally.  There does appear to be trace reflexes at the biceps muscles bilaterally, otherwise reflexes are severely depressed to absent.  Toes are downgoing bilaterally.   Assessment/Plan:  1.  Generalized muscle weakness  2.  Episode of dizziness  The patient on the clinical examination appears to have proximal muscle weakness in the arms and legs.  By history, this has been a very slowly progressive issue over several decades.  The patient has decreased reflexes, she is now developing sensation alteration in the feet.  The patient will need to be evaluated for a chronic neuropathy such as CIDP or a myopathic disorder.  Nerve conduction studies will be done on both legs and one arm, EMG will be done on one arm and one leg.  Blood work will be done today.  Given  the episode of dizziness that came on suddenly, MRI of the brain will be done.  The patient will otherwise follow-up in 4 months.    Jill Alexanders MD 10/16/2017 11:56 AM  [facility] [address] [place], [place] [address]  Phone [phone] Fax [phone]

## 2017-10-18 ENCOUNTER — Other Ambulatory Visit: Payer: Self-pay | Admitting: *Deleted

## 2017-10-18 ENCOUNTER — Ambulatory Visit (INDEPENDENT_AMBULATORY_CARE_PROVIDER_SITE_OTHER): Payer: Medicare Other | Admitting: Obstetrics and Gynecology

## 2017-10-18 ENCOUNTER — Encounter: Payer: Self-pay | Admitting: Obstetrics and Gynecology

## 2017-10-18 ENCOUNTER — Other Ambulatory Visit: Payer: Self-pay

## 2017-10-18 ENCOUNTER — Telehealth: Payer: Self-pay | Admitting: Neurology

## 2017-10-18 VITALS — BP 124/82 | HR 68 | Ht 65.35 in | Wt 144.0 lb

## 2017-10-18 DIAGNOSIS — M8580 Other specified disorders of bone density and structure, unspecified site: Secondary | ICD-10-CM

## 2017-10-18 DIAGNOSIS — Z01419 Encounter for gynecological examination (general) (routine) without abnormal findings: Secondary | ICD-10-CM | POA: Diagnosis not present

## 2017-10-18 LAB — COMPREHENSIVE METABOLIC PANEL
A/G RATIO: 1 — AB (ref 1.2–2.2)
ALT: 12 IU/L (ref 0–32)
AST: 26 IU/L (ref 0–40)
Albumin: 4.5 g/dL (ref 3.6–4.8)
Alkaline Phosphatase: 64 IU/L (ref 39–117)
BILIRUBIN TOTAL: 0.4 mg/dL (ref 0.0–1.2)
BUN/Creatinine Ratio: 25 (ref 12–28)
BUN: 21 mg/dL (ref 8–27)
CHLORIDE: 99 mmol/L (ref 96–106)
CO2: 24 mmol/L (ref 20–29)
Calcium: 9.8 mg/dL (ref 8.7–10.3)
Creatinine, Ser: 0.83 mg/dL (ref 0.57–1.00)
GFR calc Af Amer: 83 mL/min/{1.73_m2} (ref >59)
GFR, EST NON AFRICAN AMERICAN: 72 mL/min/{1.73_m2} (ref >59)
GLOBULIN, TOTAL: 4.5 g/dL (ref 1.5–4.5)
Glucose: 89 mg/dL (ref 65–99)
POTASSIUM: 4.2 mmol/L (ref 3.5–5.2)
SODIUM: 140 mmol/L (ref 134–144)
Total Protein: 9 g/dL — ABNORMAL HIGH (ref 6.0–8.5)

## 2017-10-18 LAB — ACETYLCHOLINE RECEPTOR, BINDING: AChR Binding Ab, Serum: <0.03 nmol/L (ref 0.00–0.24)

## 2017-10-18 LAB — ENA+DNA/DS+SJORGEN'S
ENA RNP Ab: 0.4 AI (ref 0.0–0.9)
ENA SM Ab Ser-aCnc: <0.2 AI (ref 0.0–0.9)
ENA SSB (LA) Ab: >8 AI — ABNORMAL HIGH (ref 0.0–0.9)
dsDNA Ab: 3 IU/mL (ref 0–9)

## 2017-10-18 LAB — VGCC ANTIBODY: VGCC Antibody: NEGATIVE

## 2017-10-18 LAB — MULTIPLE MYELOMA PANEL, SERUM
ALBUMIN SERPL ELPH-MCNC: 3.9 g/dL (ref 2.9–4.4)
ALBUMIN/GLOB SERPL: 0.8 (ref 0.7–1.7)
ALPHA 1: 0.2 g/dL (ref 0.0–0.4)
ALPHA2 GLOB SERPL ELPH-MCNC: 0.8 g/dL (ref 0.4–1.0)
B-GLOBULIN SERPL ELPH-MCNC: 1.2 g/dL (ref 0.7–1.3)
GAMMA GLOB SERPL ELPH-MCNC: 2.9 g/dL — AB (ref 0.4–1.8)
GLOBULIN, TOTAL: 5.1 g/dL — AB (ref 2.2–3.9)
IGG (IMMUNOGLOBIN G), SERUM: 3013 mg/dL — AB (ref 700–1600)
IgA/Immunoglobulin A, Serum: 492 mg/dL — ABNORMAL HIGH (ref 87–352)
IgM (Immunoglobulin M), Srm: 89 mg/dL (ref 26–217)

## 2017-10-18 LAB — CK: Total CK: 107 U/L (ref 24–173)

## 2017-10-18 LAB — ANGIOTENSIN CONVERTING ENZYME: Angio Convert Enzyme: 72 U/L (ref 14–82)

## 2017-10-18 LAB — RHEUMATOID FACTOR: RHEUMATOID FACTOR: 31.1 [IU]/mL — AB (ref 0.0–13.9)

## 2017-10-18 LAB — ANA W/REFLEX: ANA: POSITIVE — AB

## 2017-10-18 LAB — SEDIMENTATION RATE: Sed Rate: 16 mm/hr (ref 0–40)

## 2017-10-18 LAB — B. BURGDORFI ANTIBODIES

## 2017-10-18 MED ORDER — NONFORMULARY OR COMPOUNDED ITEM: 3 refills | Status: DC

## 2017-10-18 NOTE — Addendum Note (Signed)
Addended by: Burnice Logan on: 10/18/2017 11:26 AM   Modules accepted: Orders

## 2017-10-18 NOTE — Patient Instructions (Signed)
EXERCISE AND DIET:  We recommended that you start or continue a regular exercise program for good health. Regular exercise means any activity that makes your heart beat faster and makes you sweat.  We recommend exercising at least 30 minutes per day at least 3 days a week, preferably 4 or 5.  We also recommend a diet low in fat and sugar.  Inactivity, poor dietary choices and obesity can cause diabetes, heart attack, stroke, and kidney damage, among others.    ALCOHOL AND SMOKING:  Women should limit their alcohol intake to no more than 7 drinks/beers/glasses of wine (combined, not each!) per week. Moderation of alcohol intake to this level decreases your risk of breast cancer and liver damage. And of course, no recreational drugs are part of a healthy lifestyle.  And absolutely no smoking or even second hand smoke. Most people know smoking can cause heart and lung diseases, but did you know it also contributes to weakening of your bones? Aging of your skin?  Yellowing of your teeth and nails?  CALCIUM AND VITAMIN D:  Adequate intake of calcium and Vitamin D are recommended.  The recommendations for exact amounts of these supplements seem to change often, but generally speaking 600 mg of calcium (either carbonate or citrate) and 800 units of Vitamin D per day seems prudent. Certain women may benefit from higher intake of Vitamin D.  If you are among these women, your doctor will have told you during your visit.    PAP SMEARS:  Pap smears, to check for cervical cancer or precancers,  have traditionally been done yearly, although recent scientific advances have shown that most women can have pap smears less often.  However, every woman still should have a physical exam from her gynecologist every year. It will include a breast check, inspection of the vulva and vagina to check for abnormal growths or skin changes, a visual exam of the cervix, and then an exam to evaluate the size and shape of the uterus and  ovaries.  And after 69 years of age, a rectal exam is indicated to check for rectal cancers. We will also provide age appropriate advice regarding health maintenance, like when you should have certain vaccines, screening for sexually transmitted diseases, bone density testing, colonoscopy, mammograms, etc.   MAMMOGRAMS:  All women over 40 years old should have a yearly mammogram. Many facilities now offer a "3D" mammogram, which may cost around $50 extra out of pocket. If possible,  we recommend you accept the option to have the 3D mammogram performed.  It both reduces the number of women who will be called back for extra views which then turn out to be normal, and it is better than the routine mammogram at detecting truly abnormal areas.    COLONOSCOPY:  Colonoscopy to screen for colon cancer is recommended for all women at age 50.  We know, you hate the idea of the prep.  We agree, BUT, having colon cancer and not knowing it is worse!!  Colon cancer so often starts as a polyp that can be seen and removed at colonscopy, which can quite literally save your life!  And if your first colonoscopy is normal and you have no family history of colon cancer, most women don't have to have it again for 10 years.  Once every ten years, you can do something that may end up saving your life, right?  We will be happy to help you get it scheduled when you are ready.    Be sure to check your insurance coverage so you understand how much it will cost.  It may be covered as a preventative service at no cost, but you should check your particular policy.      Breast Self-Awareness Breast self-awareness means being familiar with how your breasts look and feel. It involves checking your breasts regularly and reporting any changes to your health care provider. Practicing breast self-awareness is important. A change in your breasts can be a sign of a serious medical problem. Being familiar with how your breasts look and feel allows  you to find any problems early, when treatment is more likely to be successful. All women should practice breast self-awareness, including women who have had breast implants. How to do a breast self-exam One way to learn what is normal for your breasts and whether your breasts are changing is to do a breast self-exam. To do a breast self-exam: Look for Changes  1. Remove all the clothing above your waist. 2. Stand in front of a mirror in a room with good lighting. 3. Put your hands on your hips. 4. Push your hands firmly downward. 5. Compare your breasts in the mirror. Look for differences between them (asymmetry), such as: ? Differences in shape. ? Differences in size. ? Puckers, dips, and bumps in one breast and not the other. 6. Look at each breast for changes in your skin, such as: ? Redness. ? Scaly areas. 7. Look for changes in your nipples, such as: ? Discharge. ? Bleeding. ? Dimpling. ? Redness. ? A change in position. Feel for Changes  Carefully feel your breasts for lumps and changes. It is best to do this while lying on your back on the floor and again while sitting or standing in the shower or tub with soapy water on your skin. Feel each breast in the following way:  Place the arm on the side of the breast you are examining above your head.  Feel your breast with the other hand.  Start in the nipple area and make  inch (2 cm) overlapping circles to feel your breast. Use the pads of your three middle fingers to do this. Apply light pressure, then medium pressure, then firm pressure. The light pressure will allow you to feel the tissue closest to the skin. The medium pressure will allow you to feel the tissue that is a little deeper. The firm pressure will allow you to feel the tissue close to the ribs.  Continue the overlapping circles, moving downward over the breast until you feel your ribs below your breast.  Move one finger-width toward the center of the body.  Continue to use the  inch (2 cm) overlapping circles to feel your breast as you move slowly up toward your collarbone.  Continue the up and down exam using all three pressures until you reach your armpit.  Write Down What You Find  Write down what is normal for each breast and any changes that you find. Keep a written record with breast changes or normal findings for each breast. By writing this information down, you do not need to depend only on memory for size, tenderness, or location. Write down where you are in your menstrual cycle, if you are still menstruating. If you are having trouble noticing differences in your breasts, do not get discouraged. With time you will become more familiar with the variations in your breasts and more comfortable with the exam. How often should I examine my breasts? Examine   your breasts every month. If you are breastfeeding, the best time to examine your breasts is after a feeding or after using a breast pump. If you menstruate, the best time to examine your breasts is 5-7 days after your period is over. During your period, your breasts are lumpier, and it may be more difficult to notice changes. When should I see my health care provider? See your health care provider if you notice:  A change in shape or size of your breasts or nipples.  A change in the skin of your breast or nipples, such as a reddened or scaly area.  Unusual discharge from your nipples.  A lump or thick area that was not there before.  Pain in your breasts.  Anything that concerns you.  This information is not intended to replace advice given to you by your health care provider. Make sure you discuss any questions you have with your health care provider. Document Released: 02/13/2005 Document Revised: 07/22/2015 Document Reviewed: 01/03/2015 Elsevier Interactive Patient Education  2018 [employer]vier [employer]  

## 2017-10-18 NOTE — Telephone Encounter (Signed)
I called the patient.  Blood work is unremarkable with exception that the ANA is positive with elevated SSA and SSB antibodies, rheumatoid factor is also elevated.  The patient will have EMG nerve conduction study evaluation in the future, we may consider a referral to a rheumatology physician in the future.

## 2017-10-19 ENCOUNTER — Other Ambulatory Visit: Payer: Self-pay | Admitting: Obstetrics and Gynecology

## 2017-10-19 DIAGNOSIS — Z1231 Encounter for screening mammogram for malignant neoplasm of breast: Secondary | ICD-10-CM

## 2017-10-23 ENCOUNTER — Telehealth: Payer: Self-pay | Admitting: Neurology

## 2017-10-23 NOTE — Telephone Encounter (Signed)
Karen Sarna- could [facility] get her in sooner than 11/26/17?

## 2017-10-23 NOTE — Telephone Encounter (Signed)
Patient is scheduled for an MRI with [facility] on 11-26-17 and she feels she cannot wait that long because she is experiencing dizziness and off gait walking. Is there somewhere she can have it done?

## 2017-10-23 NOTE — Telephone Encounter (Signed)
Noted, thank you

## 2017-10-23 NOTE — Telephone Encounter (Signed)
She was actually scheduled for this Friday 10/26/17... I called GI to see if they had any cancellation and they did. She is scheduled for tomorrow 10/24/17 at [facility] for arrival time of 1:20 pm.. I spoke to the patient and she is aware of the day/time change and will there at [facility] tomorrow.

## 2017-10-24 ENCOUNTER — Ambulatory Visit
Admission: RE | Admit: 2017-10-24 | Discharge: 2017-10-24 | Disposition: A | Discharge status: Home / Self Care | Payer: Medicare Other | Source: Ambulatory Visit | Attending: Neurology | Admitting: Neurology

## 2017-10-24 DIAGNOSIS — G3281 Cerebellar ataxia in diseases classified elsewhere: Secondary | ICD-10-CM | POA: Diagnosis not present

## 2017-10-24 DIAGNOSIS — M6281 Muscle weakness (generalized): Secondary | ICD-10-CM

## 2017-10-26 ENCOUNTER — Telehealth: Payer: Self-pay | Admitting: Neurology

## 2017-10-26 ENCOUNTER — Inpatient Hospital Stay: Admission: RE | Admit: 2017-10-26 | Payer: Medicare Other | Source: Ambulatory Visit

## 2017-10-26 NOTE — Telephone Encounter (Signed)
MRI of the brain shows mild white matter changes, no etiology of dizziness or weakness is identified.  I discussed this with the patient.   MRI brain 10/25/17:  IMPRESSION:  Unremarkable MRI scan the brain without contrast

## 2017-10-27 DIAGNOSIS — M3500 Sicca syndrome, unspecified: Secondary | ICD-10-CM | POA: Insufficient documentation

## 2017-11-14 ENCOUNTER — Encounter: Payer: Self-pay | Admitting: Neurology

## 2017-11-14 ENCOUNTER — Ambulatory Visit (INDEPENDENT_AMBULATORY_CARE_PROVIDER_SITE_OTHER): Payer: Medicare Other | Admitting: Neurology

## 2017-11-14 ENCOUNTER — Ambulatory Visit: Payer: Medicare Other | Admitting: Neurology

## 2017-11-14 DIAGNOSIS — R531 Weakness: Secondary | ICD-10-CM

## 2017-11-14 DIAGNOSIS — M6281 Muscle weakness (generalized): Secondary | ICD-10-CM | POA: Insufficient documentation

## 2017-11-14 NOTE — Progress Notes (Signed)
Please refer to EMG and nerve conduction procedure note.  

## 2017-11-14 NOTE — Procedures (Signed)
     HISTORY:  Karen Giles is a 67 year old patient with a history of a slowly progressive generalized weakness and fatigue issue, some discomfort associated with physical activity.  The patient comes in today for an evaluation to exclude a myopathic disorder.  NERVE CONDUCTION STUDIES:  Nerve conduction studies were performed on the right upper extremity. The distal motor latencies and motor amplitudes for the median and ulnar nerves were within normal limits. The nerve conduction velocities for these nerves were also normal. The sensory latencies for the median and ulnar nerves were normal. The F wave latency for the ulnar nerve was within normal limits.   Nerve conduction studies were performed on both lower extremities. The distal motor latencies and motor amplitudes for the peroneal and posterior tibial nerves were within normal limits. The nerve conduction velocities for these nerves were also normal. The sensory latencies for the peroneal and sural nerves were within normal limits. The F wave latencies for the posterior tibial nerves were within normal limits.   EMG STUDIES:  EMG study was performed on the right upper extremity:  The first dorsal interosseous muscle reveals 2 to 4 K units with full recruitment. No fibrillations or positive waves were noted. The abductor pollicis brevis muscle reveals 2 to 4 K units with full recruitment. No fibrillations or positive waves were noted. The extensor indicis proprius muscle reveals 1 to 3 K units with full recruitment. No fibrillations or positive waves were noted. The pronator teres muscle reveals 2 to 3 K units with full recruitment. No fibrillations or positive waves were noted. The biceps muscle reveals 1 to 2 K units with full recruitment. No fibrillations or positive waves were noted. The triceps muscle reveals 2 to 4 K units with full recruitment. No fibrillations or positive waves were noted. The anterior deltoid muscle reveals  2 to 3 K units with full recruitment. No fibrillations or positive waves were noted. The cervical paraspinal muscles were tested at 2 levels. No abnormalities of insertional activity were seen at either level tested. There was good relaxation.  EMG study was performed on the right lower extremity:  The tibialis anterior muscle reveals 2 to 4K motor units with full recruitment. No fibrillations or positive waves were seen. The peroneus tertius muscle reveals 2 to 4K motor units with full recruitment. No fibrillations or positive waves were seen. The medial gastrocnemius muscle reveals 1 to 3K motor units with full recruitment. No fibrillations or positive waves were seen. The vastus lateralis muscle reveals 2 to 4K motor units with full recruitment. No fibrillations or positive waves were seen. The iliopsoas muscle reveals 2 to 4K motor units with full recruitment. No fibrillations or positive waves were seen. The biceps femoris muscle (long head) reveals 2 to 4K motor units with full recruitment. No fibrillations or positive waves were seen. The lumbosacral paraspinal muscles were tested at 3 levels, and revealed no abnormalities of insertional activity at all 3 levels tested. There was good relaxation.   IMPRESSION:  Nerve conduction studies done on the right upper extremity and both lower extremities were within normal limits, no evidence of a neuropathy was seen.  EMG evaluation of the right upper and right lower extremities were unremarkable, no evidence of a myopathic disorder or a radiculopathy was seen.  Jill Alexanders MD 11/14/2017 3:16 PM  [facility] [address] [place], [place] [address]  Phone [phone] Fax [phone]

## 2017-11-14 NOTE — Progress Notes (Addendum)
Karen Giles comes to the office today for EMG nerve conduction study to evaluate proximal muscle weakness of the arms and legs, nerve conduction studies were completed and normal, EMG study of the right arm and right leg were completely normal, no evidence of myopathic disorder is seen.  The patient has had a positive ANA with elevated SSA and SSB antibodies, she reports chronic fatigue issues as well as some neuromuscular discomfort with physical activity.  I will get an opinion from a rheumatologist regarding her current symptoms given the lack of positive findings on this study.    [place]    Nerve / Sites Muscle Latency Ref. Amplitude Ref. Rel Amp Segments Distance Velocity Ref. Area    ms ms mV mV %  cm m/s m/s mVms  R Median - APB     Wrist APB 3.9 ?4.4 5.2 ?4.0 100 Wrist - APB 7   18.0     Upper arm APB 7.9  5.3  101 Upper arm - Wrist 22 56 ?49 20.2  R Ulnar - ADM     Wrist ADM 2.8 ?3.3 6.3 ?6.0 100 Wrist - ADM 7   25.9     B.Elbow ADM 6.4  5.7  89.8 B.Elbow - Wrist 20 56 ?49 25.0     A.Elbow ADM 8.2  5.4  95.3 A.Elbow - B.Elbow 10 55 ?49 24.5         A.Elbow - Wrist      R Peroneal - EDB     Ankle EDB 4.9 ?6.5 3.2 ?2.0 100 Ankle - EDB 9   13.7     Fib head EDB 11.5  3.0  91.3 Fib head - Ankle 29 44 ?44 12.6     Pop fossa EDB 13.1  2.8  94.4 Pop fossa - Fib head 8 48 ?44 11.9         Pop fossa - Ankle      L Peroneal - EDB     Ankle EDB 5.5 ?6.5 3.2 ?2.0 100 Ankle - EDB 9   13.6     Fib head EDB 11.9  3.1  94.9 Fib head - Ankle 29 45 ?44 13.4     Pop fossa EDB 14.1  3.3  106 Pop fossa - Fib head 10 47 ?44 13.8         Pop fossa - Ankle      R Tibial - AH     Ankle AH 5.2 ?5.8 15.1 ?4.0 100 Ankle - AH 9   34.2     Pop fossa AH 14.1  10.7  70.9 Pop fossa - Ankle 37 42 ?41 30.3  L Tibial - AH     Ankle AH 4.3 ?5.8 13.9 ?4.0 100 Ankle - AH 9   40.3     Pop fossa AH 13.0  10.7  77.1 Pop fossa - Ankle 37 43 ?41 35.8                  SNC    Nerve / Sites Rec. Site Peak Lat Ref.  Amp  Ref. Segments Distance    ms ms V V  cm  R Sural - Ankle (Calf)     Calf Ankle 4.3 ?4.4 14 ?6 Calf - Ankle 14  L Sural - Ankle (Calf)     Calf Ankle 4.1 ?4.4 16 ?6 Calf - Ankle 14  R Superficial peroneal - Ankle     Lat leg Ankle 3.9 ?4.4 7 ?6 Lat leg -  Ankle 14  L Superficial peroneal - Ankle     Lat leg Ankle 3.8 ?4.4 6 ?6 Lat leg - Ankle 14  R Median - Orthodromic (Dig II, Mid palm)     Dig II Wrist 3.2 ?3.4 11 ?10 Dig II - Wrist 13  R Ulnar - Orthodromic, (Dig V, Mid palm)     Dig V Wrist 2.6 ?3.1 17 ?5 Dig V - Wrist 71                 F  Wave    Nerve F Lat Ref.   ms ms  R Tibial - AH 51.9 ?56.0  L Tibial - AH 52.9 ?56.0  R Ulnar - ADM 24.8 ?32.0

## 2017-11-30 ENCOUNTER — Ambulatory Visit
Admission: RE | Admit: 2017-11-30 | Discharge: 2017-11-30 | Disposition: A | Discharge status: Home / Self Care | Payer: Medicare Other | Source: Ambulatory Visit | Attending: Obstetrics and Gynecology | Admitting: Obstetrics and Gynecology

## 2017-11-30 DIAGNOSIS — Z1231 Encounter for screening mammogram for malignant neoplasm of breast: Secondary | ICD-10-CM

## 2017-11-30 DIAGNOSIS — M8580 Other specified disorders of bone density and structure, unspecified site: Secondary | ICD-10-CM

## 2017-11-30 IMAGING — MG DIGITAL SCREENING BILATERAL MAMMOGRAM WITH TOMO AND CAD
8 series · 9 of 24 positions shown · non-contrast
Comparison: Previous exam(s).

CLINICAL DATA: Screening.

EXAM:
DIGITAL SCREENING BILATERAL MAMMOGRAM WITH TOMO AND CAD

[R CC synth-2D]
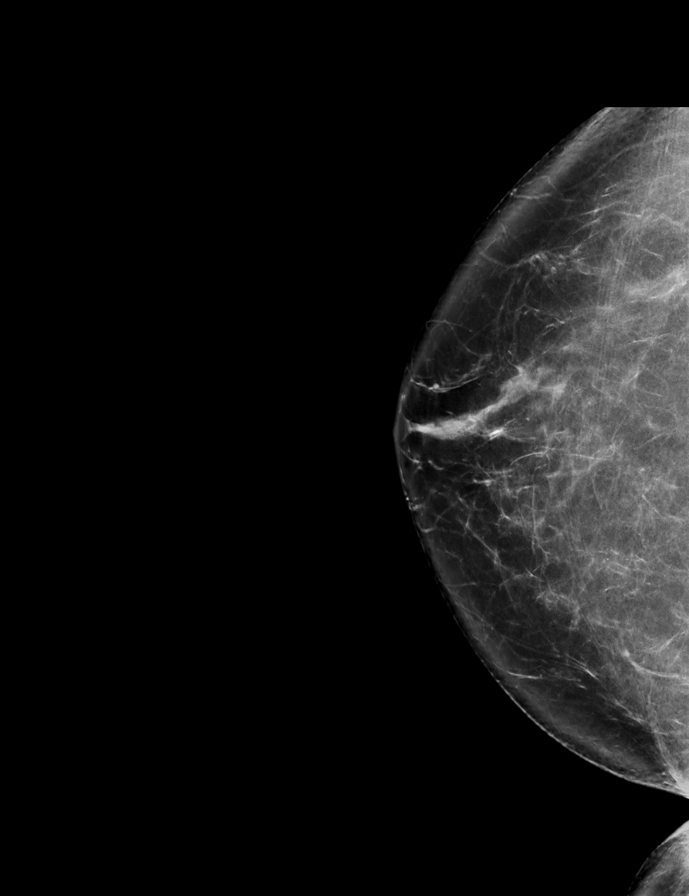

[L MLO synth-2D]
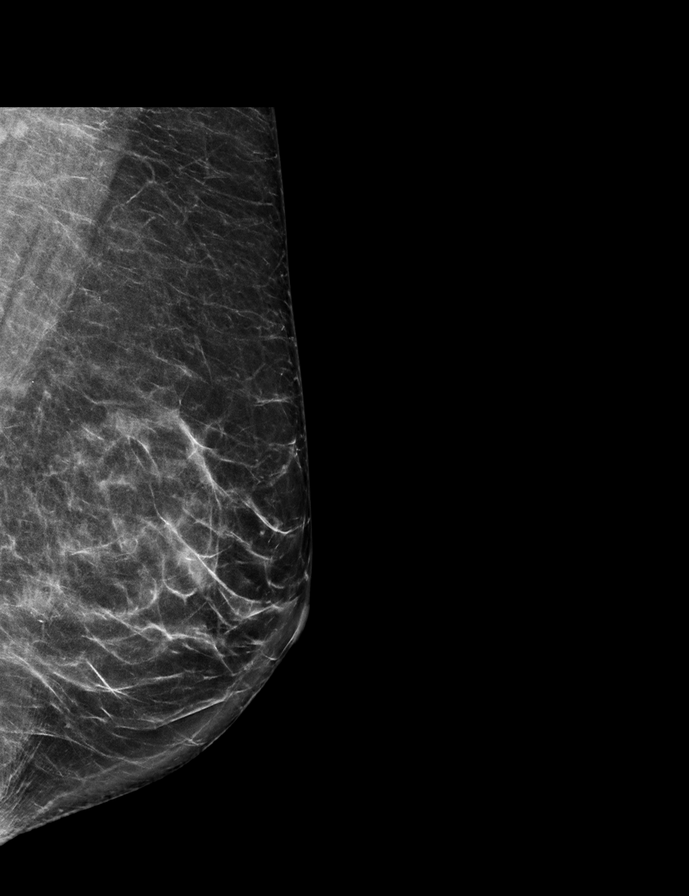

[L CC synth-2D]
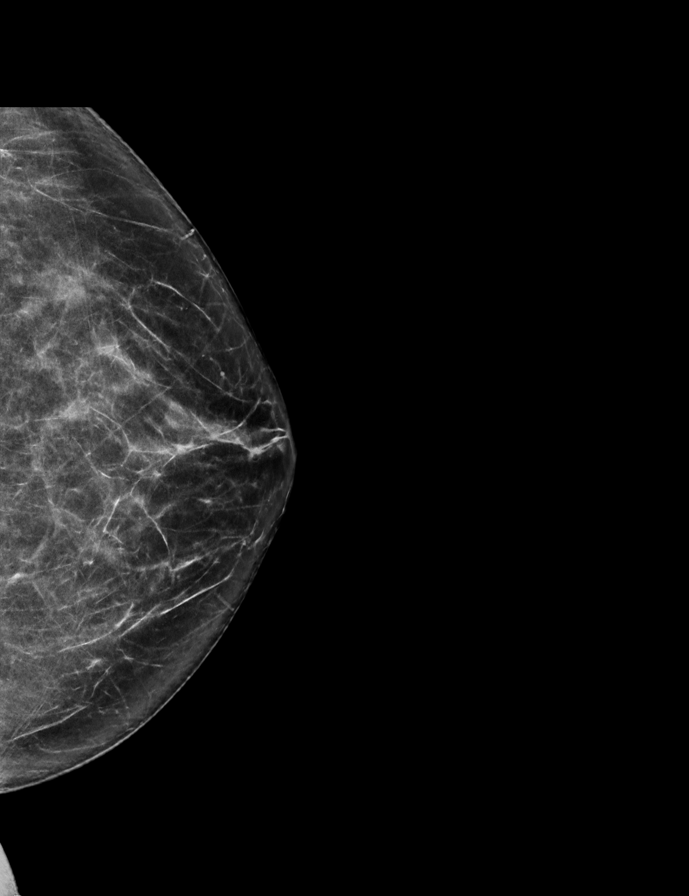

[R MLO synth-2D]
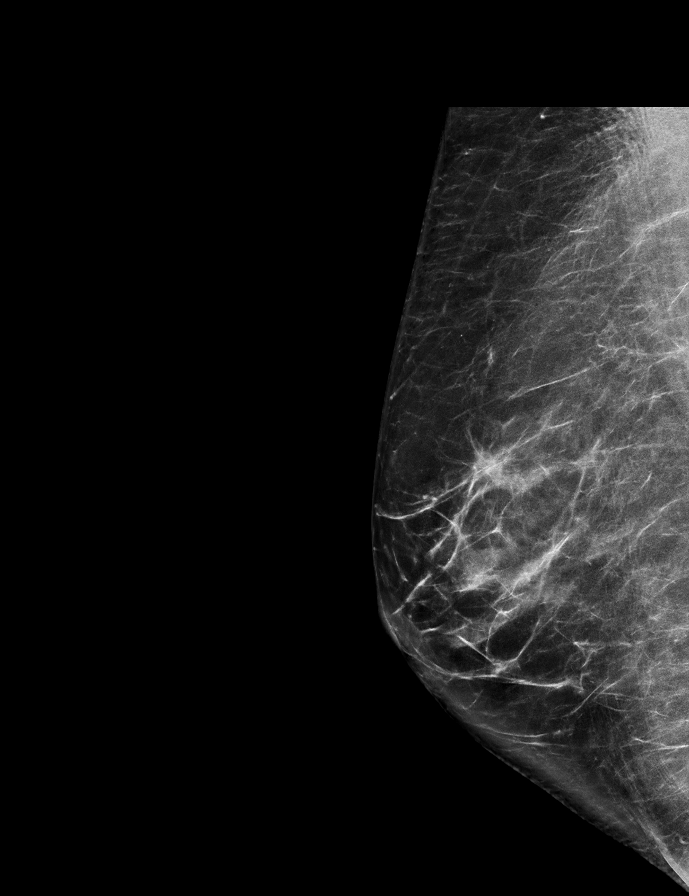

[L CC tomo · 2 of 67 frames shown]
[frame 22/67]
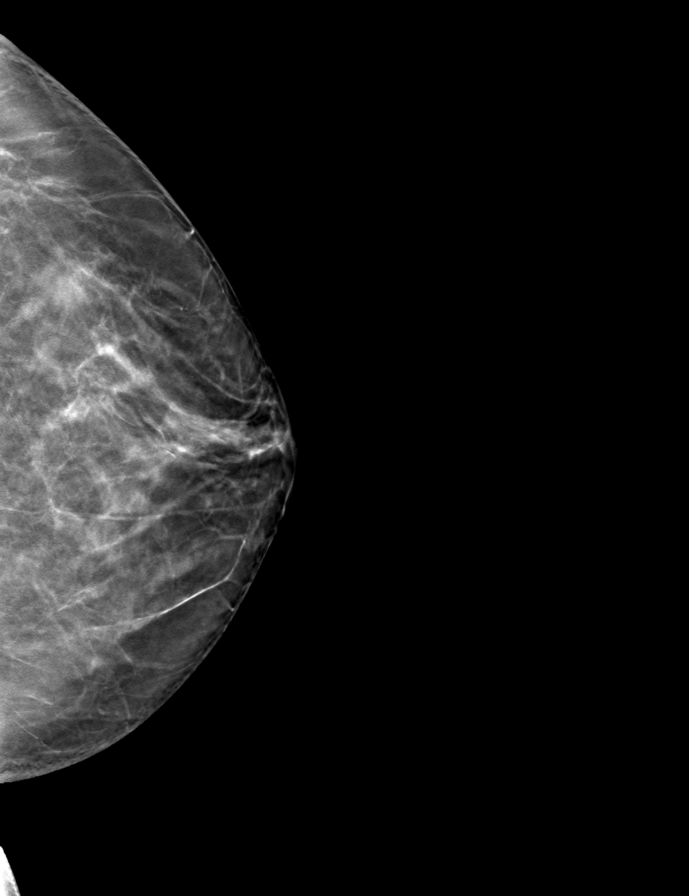
[frame 34/67]
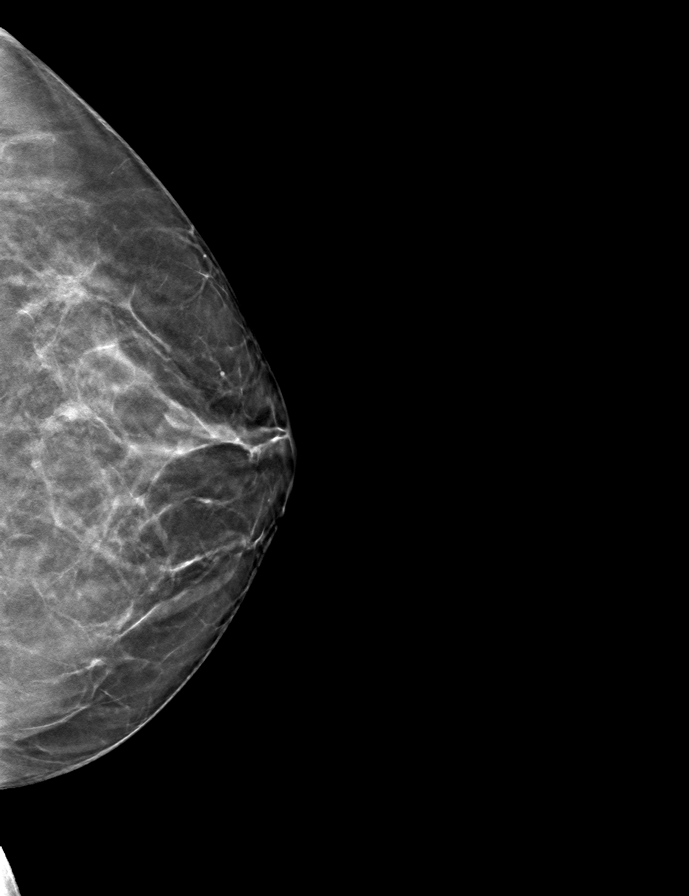

[R CC tomo · tomo slice 41/80.0]
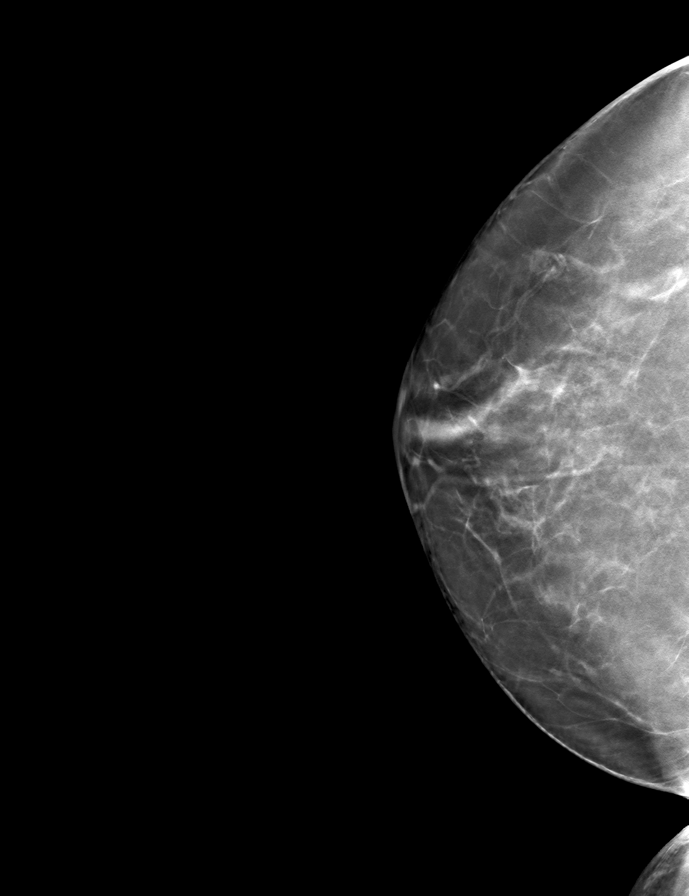

[L MLO tomo · tomo slice 35/70.0]
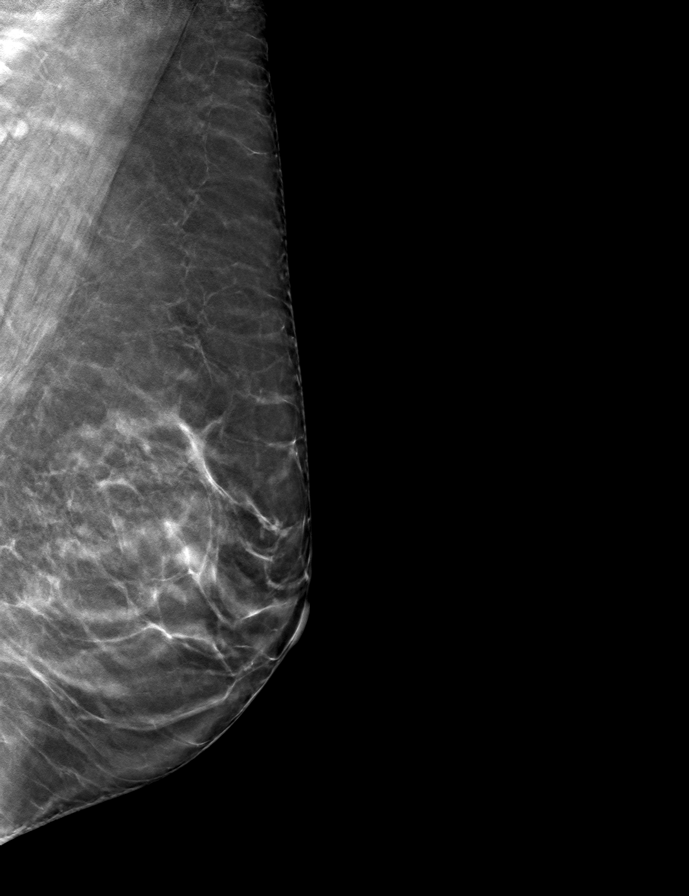

[R MLO tomo · tomo slice 39/76.0]
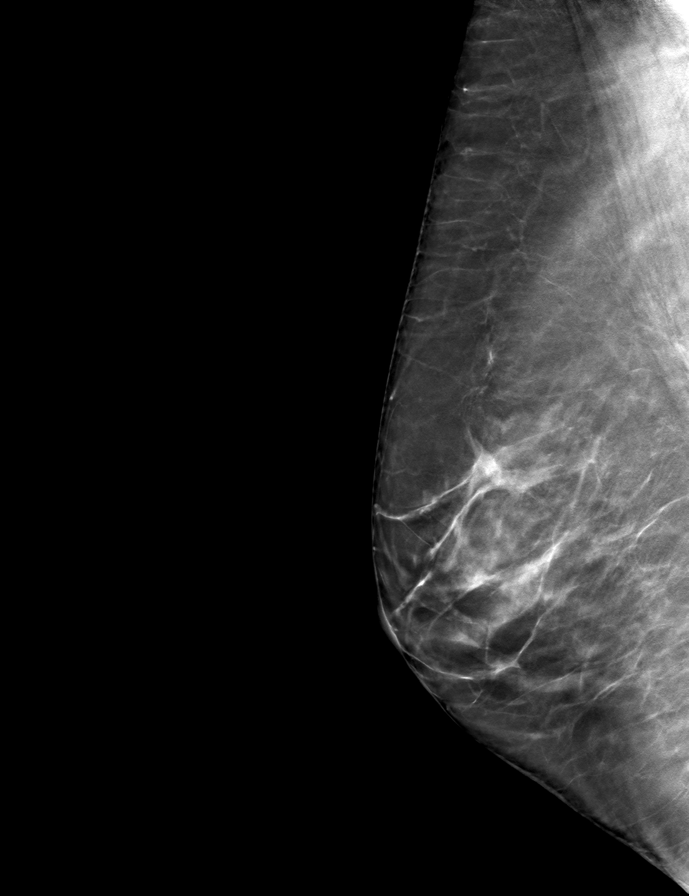

[9 of 24 positions shown; findings below may reference images not displayed]

ACR Breast Density Category b: There are scattered areas of
fibroglandular density.
FINDINGS: There are no findings suspicious for malignancy. Images were
processed with CAD.
IMPRESSION: No mammographic evidence of malignancy. A result letter of this
screening mammogram will be mailed directly to the patient.

RECOMMENDATION:
Screening mammogram in one year. (Code:[id number])

BI-RADS CATEGORY  1: Negative.

## 2017-12-06 ENCOUNTER — Ambulatory Visit: Payer: Medicare Other | Admitting: Podiatry

## 2017-12-06 DIAGNOSIS — M7751 Other enthesopathy of right foot: Secondary | ICD-10-CM

## 2017-12-06 DIAGNOSIS — M7752 Other enthesopathy of left foot: Secondary | ICD-10-CM

## 2017-12-06 NOTE — Progress Notes (Signed)
She presents today states that the plantar lateral aspect of her left foot hurts and she is about to go to [employer] and like to make sure that is not hurting.  Objective: Vital signs are stable she is alert and oriented x3 has no reproducible pain anywhere else on the left foot other than the calcaneal cuboid articulation and plantar to that.  As the [place] longus courses beneath the arcuate portion of the cuboid bone.  Assessment: Capsulitis tendinitis plantar lateral aspect left foot.  Plan: After sterile Betadine skin prep injected 20 mg Kenalog 5 mg Marcaine point maximal tenderness left plantar lateral foot.

## 2018-02-12 ENCOUNTER — Encounter: Payer: Self-pay | Admitting: Neurology

## 2018-02-12 ENCOUNTER — Ambulatory Visit: Payer: Medicare Other | Admitting: Neurology

## 2018-02-12 VITALS — BP 141/81 | HR 67 | Ht 65.35 in | Wt 144.0 lb

## 2018-02-12 DIAGNOSIS — M6281 Muscle weakness (generalized): Secondary | ICD-10-CM | POA: Diagnosis not present

## 2018-02-12 NOTE — Progress Notes (Signed)
Reason for visit: Muscle weakness, dizziness  Karen Giles is an 67 y.o. female  History of present illness:  Karen Giles is a 67 year old right-handed white female with a history of lifelong problems with muscle weakness.  The patient has noted that she has always been weak in the arms and has had difficulty lifting her head up off of the bed when lying flat.  The patient has undergone EMG and nerve conduction studies that did not clearly show evidence of a myopathic disorder.  Blood work showed a positive SSA and SSB antibody, and the rheumatoid factor was slightly elevated.  The patient was seen through rheumatology, she was felt to have Sjogren's syndrome but did not recommend any treatment.  The patient has undergone MRI of the brain that was completely normal, her dizziness is completely resolved.  The patient was told by rheumatology that she may have a chronic fatigue syndrome.  Since last seen, her underlying sense of fatigue has gotten much better, and has returned to normal.  She is still trying to remain active and work out doing muscle toning exercises.  Past Medical History:  Diagnosis Date  . GERD (gastroesophageal reflux disease)   . H. pylori infection 02/2012   treated with pylera  . HTN (hypertension)     Past Surgical History:  Procedure Laterality Date  . COLONOSCOPY    08/10/2003   RMR: Normal rectum and colon  . ENDOVENOUS ABLATION SAPHENOUS VEIN W/ LASER Left 04/18/2016   endovenous laser ablation left greater saphenous vein by Tinnie Gens MD   . ESOPHAGOGASTRODUODENOSCOPY    08/10/2003   RMR: Possible cervical esophageal web status post dilation/ Otherwise normal esophagus, stomach, and first and second portions of the duodenum  . ESOPHAGOGASTRODUODENOSCOPY (EGD) WITH ESOPHAGEAL DILATION  03/07/2012   Procedure: ESOPHAGOGASTRODUODENOSCOPY (EGD) WITH ESOPHAGEAL DILATION;  Surgeon: Daneil Dolin, MD;  Location: [facility];  Service: Endoscopy;  Laterality:  N/A;  8:30  . TOE SURGERY  February & March 8413   Silicone implants    Family History  Problem Relation Age of Onset  . Heart failure Mother   . Cancer Maternal Grandmother   . Cancer Maternal Grandfather   . Stroke Paternal Grandmother   . Heart failure Paternal Grandmother   . Cancer Paternal Grandfather   . Breast cancer Maternal Aunt 60  . Colon cancer Neg Hx     Social history:  reports that she has never smoked. She has never used smokeless tobacco. She reports current alcohol use of about 2.0 standard drinks of alcohol per week. She reports that she does not use drugs.    Allergies  Allergen Reactions  . Penicillins Rash    Medications:  Prior to Admission medications   Medication Sig Start Date End Date Taking? Authorizing Provider  chlorthalidone (HYGROTON) 25 MG tablet Take 12.5 mg by mouth daily.  03/01/12  Yes [provider]  estradiol (ESTRACE) 0.1 MG/GM vaginal cream Estradiol 0.1% cream, 1 gram vaginally qhs x 1 week, then 2 x a week at hs 09/28/16  Yes [provider]  famotidine (PEPCID) 10 MG tablet Take by mouth as needed.  03/28/17  Yes [provider]  NONFORMULARY OR COMPOUNDED ITEM Compounded Estradiol 0.1% vaginal cream, 1 gram vaginally at hs, twice weekly. Dispense: 30 grams Refills: 3 10/18/17  Yes Salvadore Dom, MD    ROS:  Out of a complete 14 system review of symptoms, the patient complains only of the following symptoms,  and all other reviewed systems are negative.  Eye discharge, eye itching, eye redness, light sensitivity Frequent waking Anxiety  Blood pressure (!) 141/81, pulse 67, height 5' 5.35" (1.66 m), weight 144 lb (65.3 kg), last menstrual period 02/27/1993.  Physical Exam  General: The patient is alert and cooperative at the time of the examination.  Skin: No significant peripheral edema is noted.   Neurologic Exam  Mental status: The patient is alert and oriented x 3 at the time of the  examination. The patient has apparent normal recent and remote memory, with an apparently normal attention span and concentration ability.   Cranial nerves: Facial symmetry is present. Speech is normal, no aphasia or dysarthria is noted. Extraocular movements are full. Visual fields are full.  Motor: The patient has evidence of mild weakness with the biceps, triceps, and external rotation of the arms bilaterally, deltoid muscle strength is fairly good, intrinsic muscle strength is good, the patient is slightly weak with hip flexion bilaterally.  Sensory examination: Soft touch sensation is symmetric on the face, arms, and legs.  Coordination: The patient has good finger-nose-finger and heel-to-shin bilaterally.  Gait and station: The patient has a normal gait. Tandem gait is normal. Romberg is negative. No drift is seen.  Reflexes: Deep tendon reflexes are symmetric.   MRI brain 10/25/17:  IMPRESSION: Unremarkable MRI scan the brain without contrast  * MRI scan images were reviewed online. I agree with the written report.    Assessment/Plan:  1.  Diffuse muscle weakness  2.  Dizziness, resolved  The patient overall is feeling much better at this time.  She apparently has had a lifelong history of muscle weakness, the etiology is not clear but the patient clearly is weak on clinical examination.  The patient will continue her muscle toning exercises, if she perceives that there is a decline in her function over time, she will call our office.  Jill Alexanders MD 02/12/2018 9:35 AM  [facility] [address] [place], [place] [address]  Phone [phone] Fax [phone]

## 2018-10-22 ENCOUNTER — Other Ambulatory Visit: Payer: Self-pay | Admitting: Obstetrics and Gynecology

## 2018-10-22 DIAGNOSIS — Z1231 Encounter for screening mammogram for malignant neoplasm of breast: Secondary | ICD-10-CM

## 2018-10-29 ENCOUNTER — Other Ambulatory Visit: Payer: Self-pay

## 2018-10-29 NOTE — Progress Notes (Signed)
68 y.o. G60P1000 Married White or Caucasian Not Hispanic or Latino female here for annual exam.  No vaginal bleeding. She is using vaginal estrogen (compounded). Sexually active, no pain.   She has unexplained muscle weakness. Has extensive evaluation. Slowly worsening. Doing okay.     Patient's last menstrual period was 02/27/1993.          Sexually active: Yes.    The current method of family planning is post menopausal status.    Exercising: Yes.    pilates, walking Smoker:  no  Health Maintenance: Pap:  09/28/2016 WNL History of abnormal Pap:  no MMG:  11/30/2017 Birads 1 negative Colonoscopy:  2015 Polyps, has one in 2 weeks.  BMD:  11/30/2017 osteopenia, FRAX 10.7/1.8% TDaP:  Up to date per patient Gardasil: N/A   reports that she has never smoked. She has never used smokeless tobacco. She reports current alcohol use of about 2.0 standard drinks of alcohol per week. She reports that she does not use drugs. Retired. Her son and 2 grandsons (70 and 69) live in [place]  Past Medical History:  Diagnosis Date  . Chronic fatigue   . GERD (gastroesophageal reflux disease)   . H. pylori infection 02/2012   treated with pylera  . HTN (hypertension)   . Muscle weakness    unexplained, worsening  . Sjogren's disease [facility])     Past Surgical History:  Procedure Laterality Date  . COLONOSCOPY    08/10/2003   RMR: Normal rectum and colon  . ENDOVENOUS ABLATION SAPHENOUS VEIN W/ LASER Left 04/18/2016   endovenous laser ablation left greater saphenous vein by Tinnie Gens MD   . ESOPHAGOGASTRODUODENOSCOPY    08/10/2003   RMR: Possible cervical esophageal web status post dilation/ Otherwise normal esophagus, stomach, and first and second portions of the duodenum  . ESOPHAGOGASTRODUODENOSCOPY (EGD) WITH ESOPHAGEAL DILATION  03/07/2012   Procedure: ESOPHAGOGASTRODUODENOSCOPY (EGD) WITH ESOPHAGEAL DILATION;  Surgeon: Daneil Dolin, MD;  [facility];  Service: Endoscopy;   Laterality: N/A;  8:30  . TOE SURGERY  February & March 0000000   Silicone implants    Current Outpatient Medications  Medication Sig Dispense Refill  . chlorthalidone (HYGROTON) 25 MG tablet Take 12.5 mg by mouth daily.     . famotidine (PEPCID) 10 MG tablet Take by mouth as needed.     . NONFORMULARY OR COMPOUNDED ITEM Compounded Estradiol 0.1% vaginal cream, 1 gram vaginally at hs, twice weekly. Dispense: 30 grams Refills: 3 30 each 3   No current facility-administered medications for this visit.     Family History  Problem Relation Age of Onset  . Heart failure Mother   . Cancer Maternal Grandmother   . Cancer Maternal Grandfather   . Stroke Paternal Grandmother   . Heart failure Paternal Grandmother   . Cancer Paternal Grandfather   . Breast cancer Maternal Aunt 60  . Colon cancer Neg Hx     Review of Systems  Constitutional: Negative.   HENT: Negative.   Eyes: Negative.   Respiratory: Negative.   Cardiovascular: Negative.   Gastrointestinal: Negative.   Endocrine: Negative.   Genitourinary: Negative.   Musculoskeletal: Negative.   Skin: Negative.   Allergic/Immunologic: Negative.   Neurological: Negative.   Hematological: Negative.   Psychiatric/Behavioral: Negative.     Exam:   BP 122/80 (BP Location: Right Arm, Patient Position: Sitting, Cuff Size: Normal)   Pulse 68   Temp 97.6 F (36.4 C) (Skin)   Ht 5' 5.35" (1.66 m)  Wt 146 lb (66.2 kg)   LMP 02/27/1993   BMI 24.03 kg/m   Weight change: @WEIGHTCHANGE @ Height:   Height: 5' 5.35" (166 cm)  Ht Readings from Last 3 Encounters:  10/31/18 5' 5.35" (1.66 m)  02/12/18 5' 5.35" (1.66 m)  10/18/17 5' 5.35" (1.66 m)    General appearance: alert, cooperative and appears stated age Head: Normocephalic, without obvious abnormality, atraumatic Neck: no adenopathy, supple, symmetrical, trachea midline and thyroid normal to inspection and palpation Lungs: clear to auscultation bilaterally Cardiovascular:  regular rate and rhythm Breasts: normal appearance, no masses or tenderness Abdomen: soft, non-tender; non distended,  no masses,  no organomegaly Extremities: extremities normal, atraumatic, no cyanosis or edema Skin: Skin color, texture, turgor normal. No rashes or lesions Lymph nodes: Cervical, supraclavicular, and axillary nodes normal. No abnormal inguinal nodes palpated Neurologic: Grossly normal   Pelvic: External genitalia:  no lesions              Urethra:  normal appearing urethra with no masses, tenderness or lesions              Bartholins and Skenes: normal                 Vagina: atrophic appearing vagina with normal color and discharge, no lesions              Cervix: no lesions               Bimanual Exam:  Uterus:  normal size, contour, position, consistency, mobility, non-tender              Adnexa: no mass, fullness, tenderness               Rectovaginal: Confirms               Anus:  normal sphincter tone, no lesions  Chaperone was present for exam.  A:  Well Woman with normal exam  Osteopenia  Vaginal atrophy, helped with vaginal estrogen (will continue)  P:   No pap   Mammogram scheduled  Discussed breast self exam  Discussed calcium and vit D intake  Colonoscopy in 2 weeks  DEXA in 10/21

## 2018-10-31 ENCOUNTER — Other Ambulatory Visit: Payer: Self-pay

## 2018-10-31 ENCOUNTER — Encounter: Payer: Self-pay | Admitting: Obstetrics and Gynecology

## 2018-10-31 ENCOUNTER — Ambulatory Visit (INDEPENDENT_AMBULATORY_CARE_PROVIDER_SITE_OTHER): Payer: Medicare Other | Admitting: Obstetrics and Gynecology

## 2018-10-31 VITALS — BP 122/80 | HR 68 | Temp 97.6°F | Ht 65.35 in | Wt 146.0 lb

## 2018-10-31 DIAGNOSIS — N952 Postmenopausal atrophic vaginitis: Secondary | ICD-10-CM | POA: Diagnosis not present

## 2018-10-31 DIAGNOSIS — Z01419 Encounter for gynecological examination (general) (routine) without abnormal findings: Secondary | ICD-10-CM

## 2018-10-31 DIAGNOSIS — M8580 Other specified disorders of bone density and structure, unspecified site: Secondary | ICD-10-CM | POA: Diagnosis not present

## 2018-10-31 MED ORDER — NONFORMULARY OR COMPOUNDED ITEM: 3 refills | Status: AC

## 2018-10-31 NOTE — Patient Instructions (Signed)
EXERCISE AND DIET:  We recommended that you start or continue a regular exercise program for good health. Regular exercise means any activity that makes your heart beat faster and makes you sweat.  We recommend exercising at least 30 minutes per day at least 3 days a week, preferably 4 or 5.  We also recommend a diet low in fat and sugar.  Inactivity, poor dietary choices and obesity can cause diabetes, heart attack, stroke, and kidney damage, among others.    ALCOHOL AND SMOKING:  Women should limit their alcohol intake to no more than 7 drinks/beers/glasses of wine (combined, not each!) per week. Moderation of alcohol intake to this level decreases your risk of breast cancer and liver damage. And of course, no recreational drugs are part of a healthy lifestyle.  And absolutely no smoking or even second hand smoke. Most people know smoking can cause heart and lung diseases, but did you know it also contributes to weakening of your bones? Aging of your skin?  Yellowing of your teeth and nails?  CALCIUM AND VITAMIN D:  Adequate intake of calcium and Vitamin D are recommended.  The recommendations for exact amounts of these supplements seem to change often, but generally speaking 1,200 mg of calcium (between diet and supplement) and 800 units of Vitamin D per day seems prudent. Certain women may benefit from higher intake of Vitamin D.  If you are among these women, your doctor will have told you during your visit.    PAP SMEARS:  Pap smears, to check for cervical cancer or precancers,  have traditionally been done yearly, although recent scientific advances have shown that most women can have pap smears less often.  However, every woman still should have a physical exam from her gynecologist every year. It will include a breast check, inspection of the vulva and vagina to check for abnormal growths or skin changes, a visual exam of the cervix, and then an exam to evaluate the size and shape of the uterus and  ovaries.  And after 70 years of age, a rectal exam is indicated to check for rectal cancers. We will also provide age appropriate advice regarding health maintenance, like when you should have certain vaccines, screening for sexually transmitted diseases, bone density testing, colonoscopy, mammograms, etc.   MAMMOGRAMS:  All women over 40 years old should have a yearly mammogram. Many facilities now offer a "3D" mammogram, which may cost around $50 extra out of pocket. If possible,  we recommend you accept the option to have the 3D mammogram performed.  It both reduces the number of women who will be called back for extra views which then turn out to be normal, and it is better than the routine mammogram at detecting truly abnormal areas.    COLON CANCER SCREENING: Now recommend starting at age 45. At this time colonoscopy is not covered for routine screening until 50. There are take home tests that can be done between 45-49.   COLONOSCOPY:  Colonoscopy to screen for colon cancer is recommended for all women at age 50.  We know, you hate the idea of the prep.  We agree, BUT, having colon cancer and not knowing it is worse!!  Colon cancer so often starts as a polyp that can be seen and removed at colonscopy, which can quite literally save your life!  And if your first colonoscopy is normal and you have no family history of colon cancer, most women don't have to have it again for   10 years.  Once every ten years, you can do something that may end up saving your life, right?  We will be happy to help you get it scheduled when you are ready.  Be sure to check your insurance coverage so you understand how much it will cost.  It may be covered as a preventative service at no cost, but you should check your particular policy.      Breast Self-Awareness Breast self-awareness means being familiar with how your breasts look and feel. It involves checking your breasts regularly and reporting any changes to your  health care provider. Practicing breast self-awareness is important. A change in your breasts can be a sign of a serious medical problem. Being familiar with how your breasts look and feel allows you to find any problems early, when treatment is more likely to be successful. All women should practice breast self-awareness, including women who have had breast implants. How to do a breast self-exam One way to learn what is normal for your breasts and whether your breasts are changing is to do a breast self-exam. To do a breast self-exam: Look for Changes  1. Remove all the clothing above your waist. 2. Stand in front of a mirror in a room with good lighting. 3. Put your hands on your hips. 4. Push your hands firmly downward. 5. Compare your breasts in the mirror. Look for differences between them (asymmetry), such as: ? Differences in shape. ? Differences in size. ? Puckers, dips, and bumps in one breast and not the other. 6. Look at each breast for changes in your skin, such as: ? Redness. ? Scaly areas. 7. Look for changes in your nipples, such as: ? Discharge. ? Bleeding. ? Dimpling. ? Redness. ? A change in position. Feel for Changes Carefully feel your breasts for lumps and changes. It is best to do this while lying on your back on the floor and again while sitting or standing in the shower or tub with soapy water on your skin. Feel each breast in the following way:  Place the arm on the side of the breast you are examining above your head.  Feel your breast with the other hand.  Start in the nipple area and make  inch (2 cm) overlapping circles to feel your breast. Use the pads of your three middle fingers to do this. Apply light pressure, then medium pressure, then firm pressure. The light pressure will allow you to feel the tissue closest to the skin. The medium pressure will allow you to feel the tissue that is a little deeper. The firm pressure will allow you to feel the tissue  close to the ribs.  Continue the overlapping circles, moving downward over the breast until you feel your ribs below your breast.  Move one finger-width toward the center of the body. Continue to use the  inch (2 cm) overlapping circles to feel your breast as you move slowly up toward your collarbone.  Continue the up and down exam using all three pressures until you reach your armpit.  Write Down What You Find  Write down what is normal for each breast and any changes that you find. Keep a written record with breast changes or normal findings for each breast. By writing this information down, you do not need to depend only on memory for size, tenderness, or location. Write down where you are in your menstrual cycle, if you are still menstruating. If you are having trouble noticing differences   in your breasts, do not get discouraged. With time you will become more familiar with the variations in your breasts and more comfortable with the exam. How often should I examine my breasts? Examine your breasts every month. If you are breastfeeding, the best time to examine your breasts is after a feeding or after using a breast pump. If you menstruate, the best time to examine your breasts is 5-7 days after your period is over. During your period, your breasts are lumpier, and it may be more difficult to notice changes. When should I see my health care provider? See your health care provider if you notice:  A change in shape or size of your breasts or nipples.  A change in the skin of your breast or nipples, such as a reddened or scaly area.  Unusual discharge from your nipples.  A lump or thick area that was not there before.  Pain in your breasts.  Anything that concerns you.  

## 2018-12-04 ENCOUNTER — Other Ambulatory Visit: Payer: Self-pay

## 2018-12-04 ENCOUNTER — Ambulatory Visit
Admission: RE | Admit: 2018-12-04 | Discharge: 2018-12-04 | Disposition: A | Discharge status: Home / Self Care | Payer: Medicare Other | Source: Ambulatory Visit | Attending: Obstetrics and Gynecology | Admitting: Obstetrics and Gynecology

## 2018-12-04 DIAGNOSIS — Z1231 Encounter for screening mammogram for malignant neoplasm of breast: Secondary | ICD-10-CM

## 2018-12-04 IMAGING — MG MM DIGITAL SCREENING BILAT W/ TOMO W/ CAD
8 series · 9 of 24 positions shown · non-contrast
Comparison: Previous exam(s).

CLINICAL DATA: Screening.

EXAM:
DIGITAL SCREENING BILATERAL MAMMOGRAM WITH TOMO AND CAD

[L CC synth-2D]
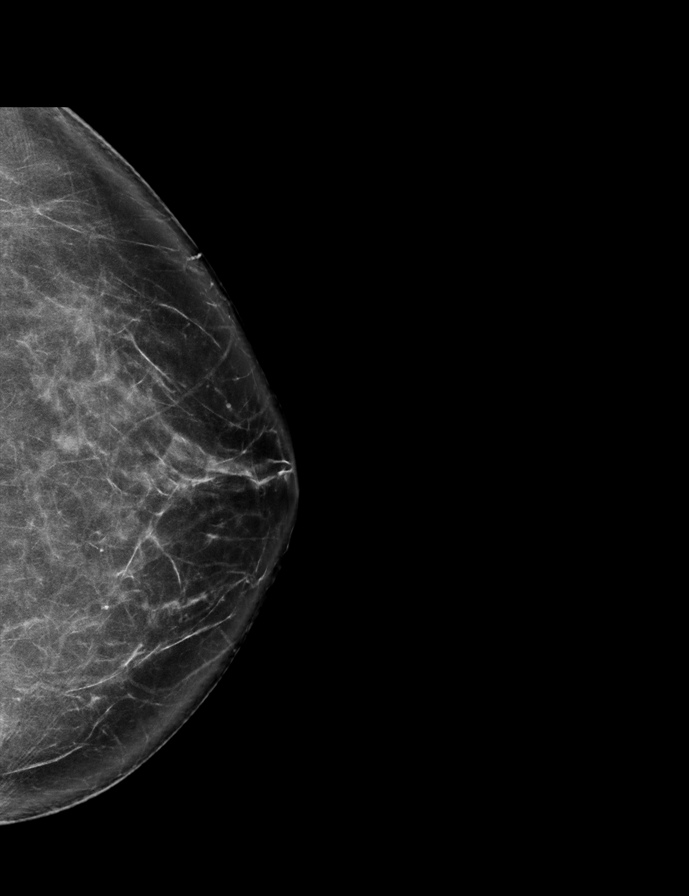

[L MLO synth-2D]
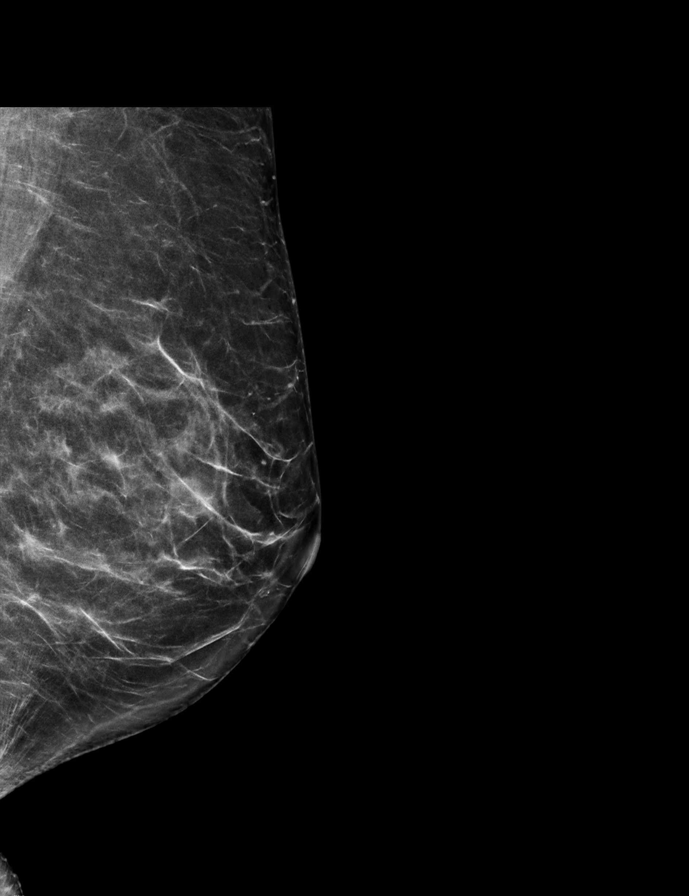

[R MLO synth-2D]
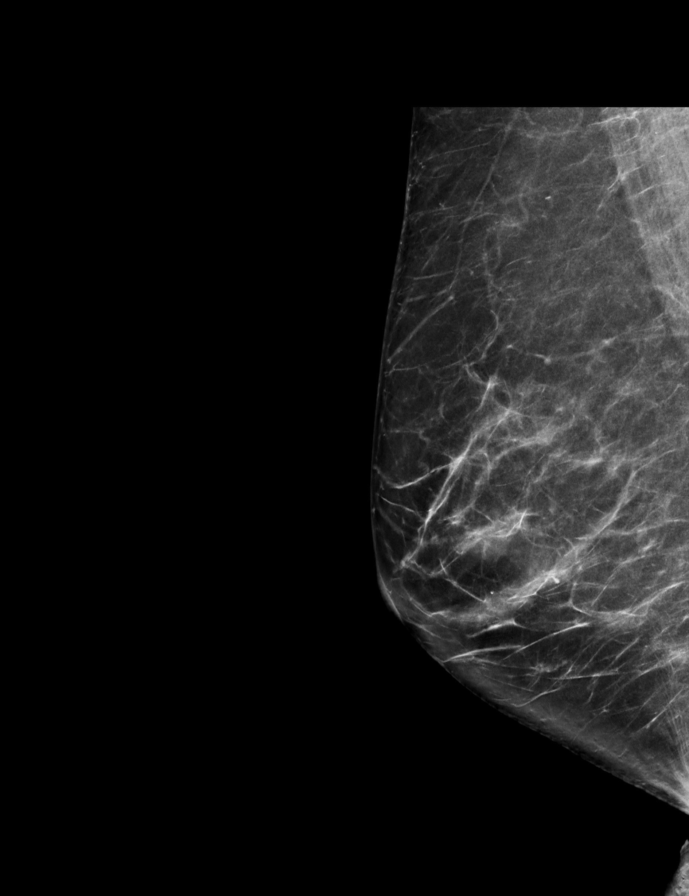

[R CC synth-2D]
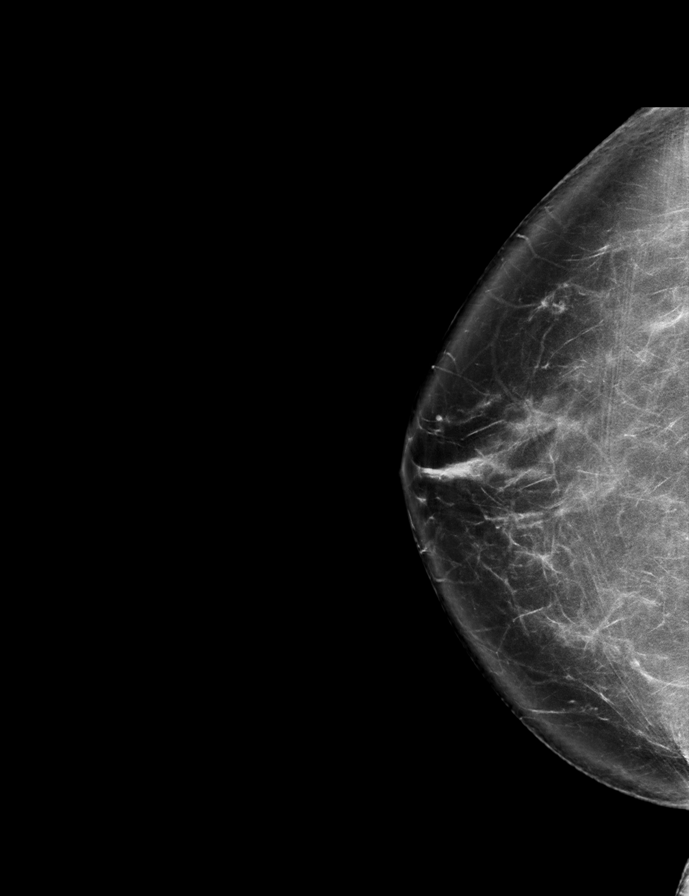

[L MLO tomo · 2 of 65 frames shown]
[frame 21/65]
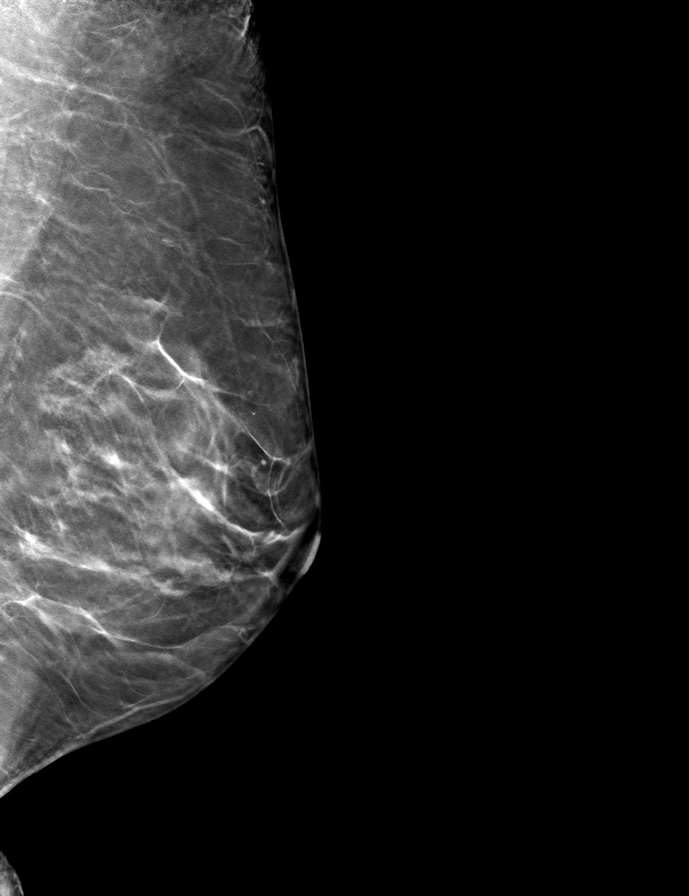
[frame 33/65]
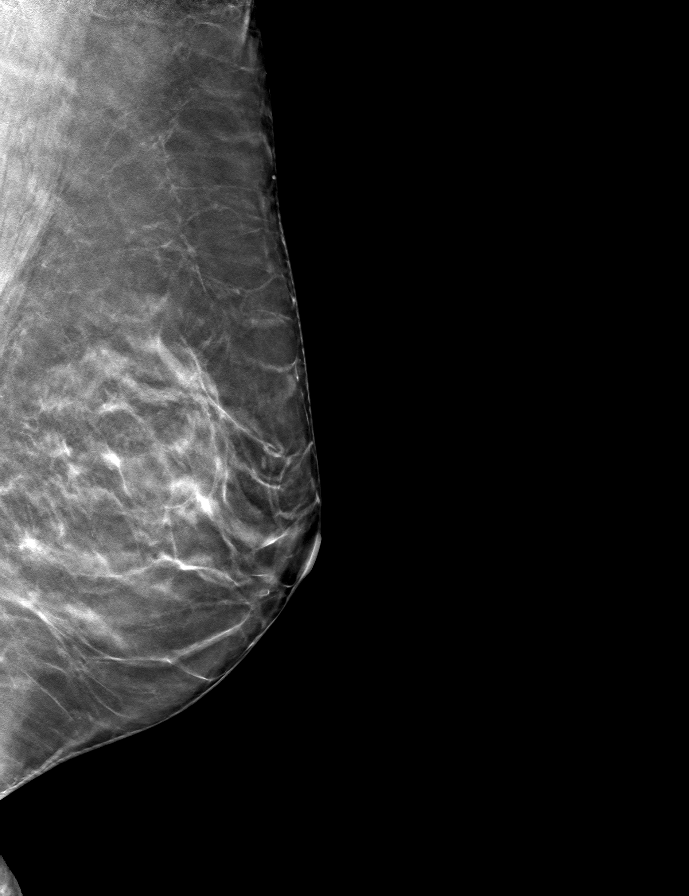

[L CC tomo · tomo slice 37/72.0]
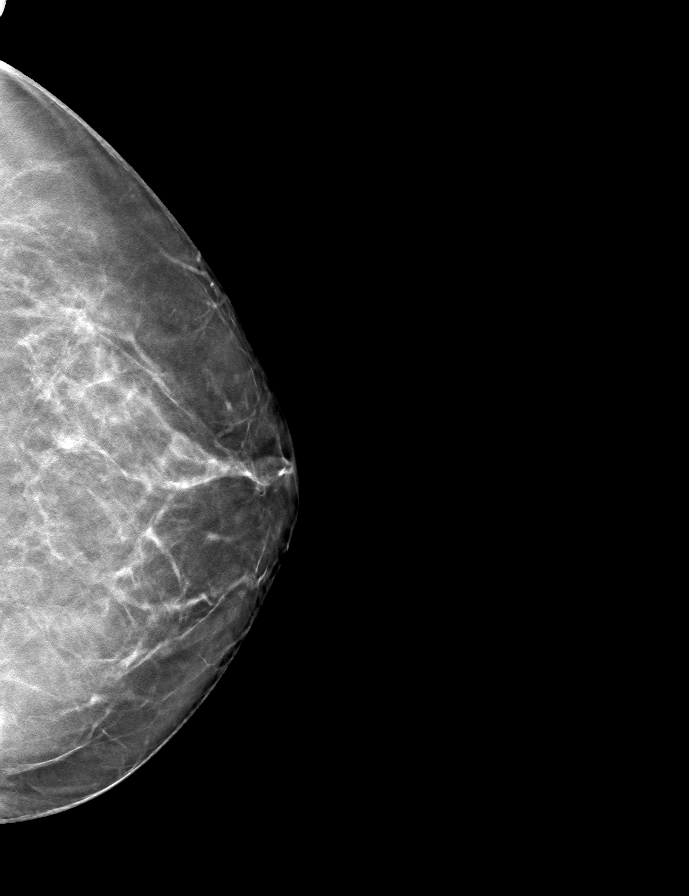

[R MLO tomo · tomo slice 36/71.0]
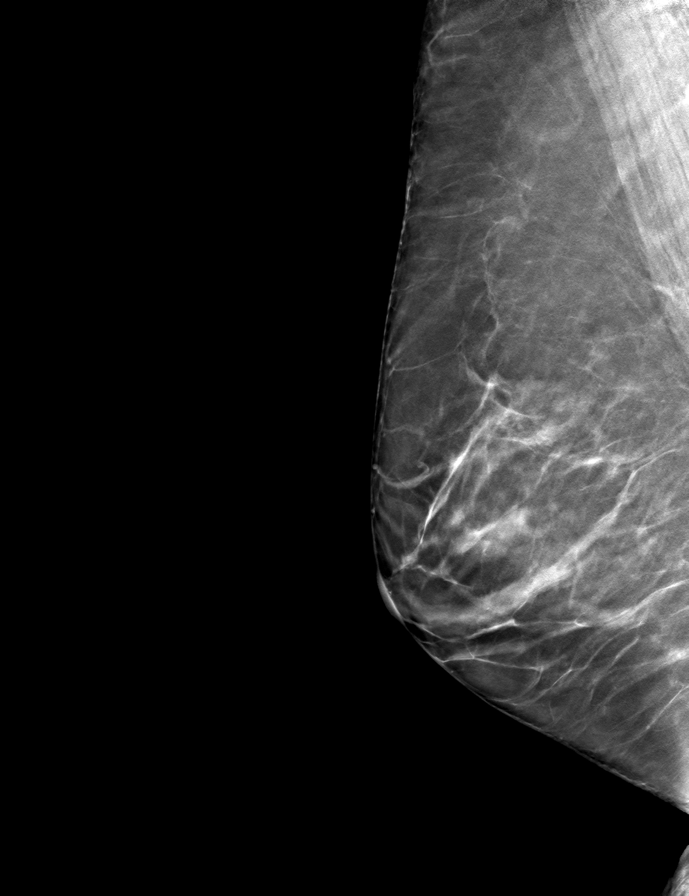

[R CC tomo · tomo slice 40/79.0]
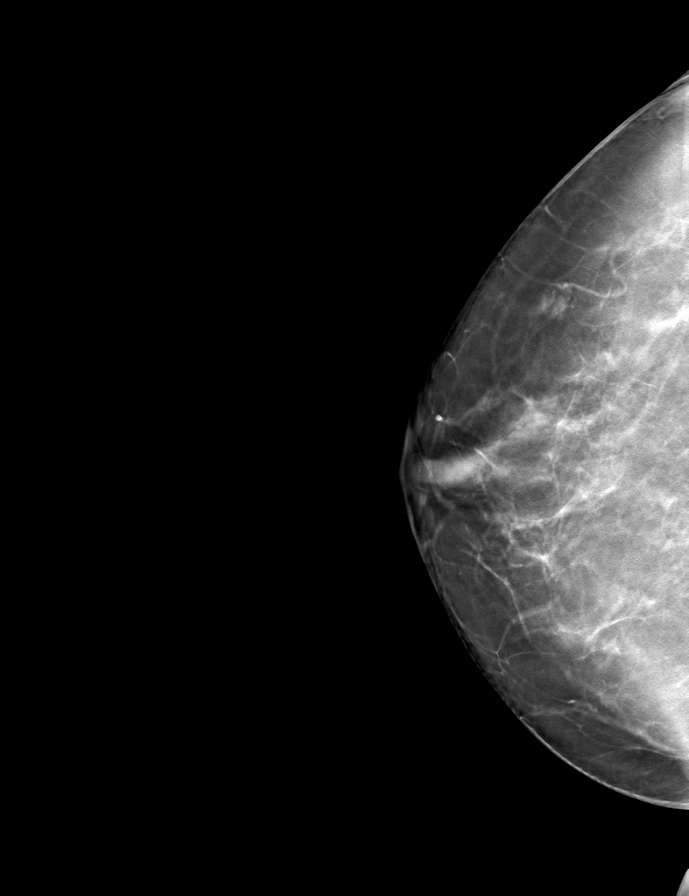

[9 of 24 positions shown; findings below may reference images not displayed]

ACR Breast Density Category b: There are scattered areas of
fibroglandular density.
FINDINGS: There are no findings suspicious for malignancy. Images were
processed with CAD.
IMPRESSION: No mammographic evidence of malignancy. A result letter of this
screening mammogram will be mailed directly to the patient.

RECOMMENDATION:
Screening mammogram in one year. (Code:[id number])

BI-RADS CATEGORY  1: Negative.

## 2019-01-03 ENCOUNTER — Other Ambulatory Visit: Payer: Self-pay

## 2019-01-03 ENCOUNTER — Ambulatory Visit (HOSPITAL_COMMUNITY): Payer: Medicare Other | Attending: Internal Medicine | Admitting: Physical Therapy

## 2019-01-03 ENCOUNTER — Encounter (HOSPITAL_COMMUNITY): Payer: Self-pay | Admitting: Physical Therapy

## 2019-01-03 DIAGNOSIS — M6281 Muscle weakness (generalized): Secondary | ICD-10-CM | POA: Insufficient documentation

## 2019-01-03 NOTE — Therapy (Signed)
[place], [place], [address] Phone: [phone]   Fax:  [phone]  Physical Therapy Evaluation  Patient Details  Name: Karen Giles MRN: [id number] Date of Birth: [birth year] Referring Provider (PT): Karen Giles   Encounter Date: 01/03/2019  PT End of Session - 01/03/19 1038    Visit Number  1    Number of Visits  8    Date for PT Re-Evaluation  [id number]   cert ends   Authorization Type  [employer] medicare    PT Start Time  [phone]    PT Stop Time  1033    PT Time Calculation (min)  38 min    Activity Tolerance  Patient tolerated treatment well       Past Medical History:  Diagnosis Date  . Chronic fatigue   . GERD (gastroesophageal reflux disease)   . H. pylori infection 02/2012   treated with pylera  . HTN (hypertension)   . Muscle weakness    unexplained, worsening  . Sjogren's disease [facility])     Past Surgical History:  Procedure Laterality Date  . COLONOSCOPY    08/10/2003   RMR: Normal rectum and colon  . ENDOVENOUS ABLATION SAPHENOUS VEIN W/ LASER Left 04/18/2016   endovenous laser ablation left greater saphenous vein by Karen Gens MD   . ESOPHAGOGASTRODUODENOSCOPY    08/10/2003   RMR: Possible cervical esophageal web status post dilation/ Otherwise normal esophagus, stomach, and first and second portions of the duodenum  . ESOPHAGOGASTRODUODENOSCOPY (EGD) WITH ESOPHAGEAL DILATION  03/07/2012   Procedure: ESOPHAGOGASTRODUODENOSCOPY (EGD) WITH ESOPHAGEAL DILATION;  Surgeon: Karen Dolin, MD;  [facility];  Service: Endoscopy;  Laterality: N/A;  8:30  . TOE SURGERY  February & March 0000000   Silicone implants    There were no vitals filed for this visit.   Subjective Assessment - 01/03/19 0958    Subjective  Karen Giles states that she has been to numerous MD's as she has been having a lot of difficulty with balance, mm weakness and it appears  to be getting worse.  She has had various  tests but she has not had any definative tests.  She states that the weakness is global cervical, UE and LE.  She has trouble getting up from a chair, going up and down steps and getting off of the floor.  She does pilates and that has really helpped her, prior to Covid she went to the gym.    Limitations  Lifting;Other (comment)   steps   How long can you sit comfortably?  no problem    How long can you stand comfortably?  no problem    How long can you walk comfortably?  an hour    Patient Stated Goals  to have better balance and power.    Currently in Pain?  --   aching in legs if she exercises        [facility] PT Assessment - 01/03/19 0001      Assessment   Medical Diagnosis  mm weakness    Referring Provider (PT)  Karen Giles    Onset Date/Surgical Date  03/30/18    Prior Therapy  none       Precautions   Precautions  None      Restrictions   Weight Bearing Restrictions  No      Balance Screen   Has the patient fallen in the past 6 months  No  Has the patient had a decrease in activity level because of a fear of falling?   Yes    Is the patient reluctant to leave their home because of a fear of falling?   No      Home Film/video editor residence      Prior Function   Level of Independence  Independent    Vocation  Retired      Associate Professor   Overall Cognitive Status  Within Functional Limits for tasks assessed      Functional Tests   Functional tests  Sit to Stand;Single leg stance      Single Leg Stance   Comments  Rt:  19" , Lt 10"      Sit to Stand   Comments  7 in 30 seconds       ROM / Strength   AROM / PROM / Strength  Strength      Strength   Strength Assessment Site  Hip;Knee;Ankle    Right/Left Hip  Right;Left    Right Hip Flexion  5/5    Right Hip Extension  3+/5    Right Hip ABduction  4/5    Left Hip Flexion  5/5    Left Hip Extension  3+/5    Left Hip ABduction  5/5    Right/Left Knee  Right;Left    Right Knee Flexion   5/5    Right Knee Extension  5/5    Left Knee Flexion  5/5    Left Knee Extension  5/5    Right/Left Ankle  Right;Left    Right Ankle Dorsiflexion  4/5    Left Ankle Dorsiflexion  5/5                Objective measurements completed on examination: See above findings.      [facility] Adult PT Treatment/Exercise - 01/03/19 0001      Exercises   Exercises  Knee/Hip      Knee/Hip Exercises: Standing   SLS  x 3 B       Knee/Hip Exercises: Supine   Single Leg Bridge  Both;10 reps      Knee/Hip Exercises: Sidelying   Hip ABduction  Right;10 reps      Knee/Hip Exercises: Prone   Hip Extension  10 reps             PT Education - 01/03/19 1036    Education Details  HEP    Person(s) Educated  Patient    Methods  Explanation;Tactile cues;Verbal cues;Handout    Comprehension  Verbalized understanding;Returned demonstration       PT Short Term Goals - 01/03/19 1047      PT SHORT TERM GOAL #1   Title  PT LE strength to increase 1/2 grade to allow pt to come sit to stand from a low chair without difficulty    Time  2    Period  Weeks    Status  New    Target Date  01/17/19      PT SHORT TERM GOAL #2   Title  PT to be able to single leg stance on both LE for 20 seconds to reduce the risk of falling    Time  2    Period  Weeks    Status  New        PT Long Term Goals - 01/03/19 1048      PT LONG TERM GOAL #1   Title  PT to  be I in advance HEP to increase her LE strength by one level to be able to go up and down 10 steps in a reciprocal manner without difficulty.    Time  4    Period  Weeks    Status  New    Target Date  01/31/19      PT LONG TERM GOAL #2   Title  PT balance to improve to allow pt to be able to single leg stance for 40 seconds B to reduce her risk of falling.    Time  4    Period  Weeks             Plan - 01/03/19 1039    Clinical Impression Statement  Karen Giles is a 68 yo female who has noted progressive, global mm weakening  over months.  It has gotten to the point where she is having difficulty getting off the floor, getting up from low lying chairs and going up and down steps.  She has also noticed that her arms and her balance has been affected.  Evaluation demonstates decreased balance and strength, she has been referred to skilled PT to address these issues and improve her functioning level.    Examination-Activity Limitations  Stairs;Carry;Lift    Examination-Participation Restrictions  Tour manager    Stability/Clinical Decision Making  Stable/Uncomplicated    Clinical Decision Making  Low    Rehab Potential  Good    PT Frequency  2x / week    PT Duration  4 weeks    PT Treatment/Interventions  Stair training;Therapeutic activities;Therapeutic exercise;Balance training    PT Next Visit Plan  sit to stand, rocker board, tandem stance on foam, side step with tband, steps, forward step ups, quadriped opposite arm/leg, wall push up progress strengthening and balace at a high level.    PT Home Exercise Plan  single leg bridge, single leg stance , prone straight leg raise, hip abduction       Patient will benefit from skilled therapeutic intervention in order to improve the following deficits and impairments:  Decreased activity tolerance, Decreased strength, Decreased balance  Visit Diagnosis: Muscle weakness (generalized) - Plan: PT plan of care cert/re-cert     Problem List Patient Active Problem List   Diagnosis Date Noted  . Muscle weakness (generalized) 11/14/2017  . Varicose veins of left lower extremity with complications [id number]  . Chronic venous insufficiency 10/14/2015  . Osteopenia 09/17/2014  . Dysphagia 03/04/2012  . GERD (gastroesophageal reflux disease) 03/04/2012   Rayetta Humphrey, PT CLT [phone] 01/03/2019, 10:53 AM  [facility] [address] [place], [place], [address] Phone: [phone]   Fax:  [phone]  Name: Karen Giles MRN: [id number] Date of Birth: Jan 27, 1951

## 2019-01-03 NOTE — Patient Instructions (Addendum)
Strengthening: Hip Abduction (Side-Lying)    Tighten muscles on front of right thigh, then lift leg __18__ inches from surface, keeping knee locked.  Repeat __15__ times per set. Do ___1_ sets per session. Do ___2_ sessions per day.  http://orth.exer.us/622   Copyright  VHI. All rights reserved.  Strengthening: Hip Extension (Prone)    Tighten muscles on front of left thigh, then lift leg __2__ inches from surface, keeping knee locked. Repeat to right  Repeat 10____ times per set. Do _1___ sets per session. Do _2___ sessions per day.  http://orth.exer.us/620   Copyright  VHI. All rights reserved.  Bridging: with Straight Leg Raise    With legs bent, lift buttocks __10__ inches from floor. Then slowly extend right knee, keeping stomach tight. Repeat to opposite side  Repeat __10__ times per set. Do __1__ sets per session. Do __2__ sessions per day.  http://orth.exer.us/1104   Copyright  VHI. All rights reserved.  Single Leg - Eyes Open    Holding support, lift right leg while maintaining balance over other leg. Progress to removing hands from support surface for longer periods of time. Hold__30__ seconds.  Repeat both legs  Repeat _3___ times per session. Do __2__ sessions per day.  Copyright  VHI. All rights reserved.

## 2019-01-06 ENCOUNTER — Ambulatory Visit (HOSPITAL_COMMUNITY): Payer: Medicare Other | Admitting: Physical Therapy

## 2019-01-06 ENCOUNTER — Encounter (HOSPITAL_COMMUNITY): Payer: Self-pay | Admitting: Physical Therapy

## 2019-01-06 ENCOUNTER — Other Ambulatory Visit: Payer: Self-pay

## 2019-01-06 DIAGNOSIS — M6281 Muscle weakness (generalized): Secondary | ICD-10-CM

## 2019-01-06 NOTE — Therapy (Signed)
[place], [place], [address] Phone: [phone]   Fax:  [phone]  Physical Therapy Treatment  Patient Details  Name: Karen Giles MRN: [id number] Date of Birth: 1950/05/06 Referring Provider (PT): Asencion Noble   Encounter Date: 01/06/2019  PT End of Session - 01/06/19 1437    Visit Number  2    Number of Visits  8    Date for PT Re-Evaluation  [id number]   cert ends   Authorization Type  UHC medicare    PT Start Time  1355    PT Stop Time  1437    PT Time Calculation (min)  42 min    Activity Tolerance  Patient tolerated treatment well       Past Medical History:  Diagnosis Date  . Chronic fatigue   . GERD (gastroesophageal reflux disease)   . H. pylori infection 02/2012   treated with pylera  . HTN (hypertension)   . Muscle weakness    unexplained, worsening  . Sjogren's disease [facility])     Past Surgical History:  Procedure Laterality Date  . COLONOSCOPY    08/10/2003   RMR: Normal rectum and colon  . ENDOVENOUS ABLATION SAPHENOUS VEIN W/ LASER Left 04/18/2016   endovenous laser ablation left greater saphenous vein by Tinnie Gens MD   . ESOPHAGOGASTRODUODENOSCOPY    08/10/2003   RMR: Possible cervical esophageal web status post dilation/ Otherwise normal esophagus, stomach, and first and second portions of the duodenum  . ESOPHAGOGASTRODUODENOSCOPY (EGD) WITH ESOPHAGEAL DILATION  03/07/2012   Procedure: ESOPHAGOGASTRODUODENOSCOPY (EGD) WITH ESOPHAGEAL DILATION;  Surgeon: Daneil Dolin, MD;  [facility];  Service: Endoscopy;  Laterality: N/A;  8:30  . TOE SURGERY  February & March 0000000   Silicone implants    There were no vitals filed for this visit.  Subjective Assessment - 01/06/19 1401    Subjective  PT states that she did her exercises twice a day over the weekend without difficulty.    Limitations  Lifting;Other (comment)   steps   How long can you sit comfortably?  no problem     How long can you stand comfortably?  no problem    How long can you walk comfortably?  an hour    Patient Stated Goals  to have better balance and power.    Currently in Pain?  No/denies                       [facility] Adult PT Treatment/Exercise - 01/06/19 0001      Balance Poses: Yoga   Tree Pose  2 reps;30 seconds      Exercises   Exercises  Knee/Hip      Knee/Hip Exercises: Machines for Strengthening   Total Gym Leg Press  4PL x 10 slow       Knee/Hip Exercises: Standing   Forward Step Up  Both;10 reps;Hand Hold: 0;Step Height: 6"    Rocker Board  2 minutes    SLS  x 3 B       Knee/Hip Exercises: Seated   Other Seated Knee/Hip Exercises  cervical excursions     Sit to Sand  10 reps      Knee/Hip Exercises: Prone   Other Prone Exercises  quadriped opposit arm/leg x 10           Balance Exercises - 01/06/19 1410      Balance Exercises: Standing   Tandem  Stance  Eyes open;Foam/compliant surface;5 reps;Other (comment)   with head turns    Sidestepping  2 reps;Theraband    Other Standing Exercises  Paloff x 10 B  With red tband          PT Short Term Goals - 01/06/19 1359      PT SHORT TERM GOAL #1   Title  PT LE strength to increase 1/2 grade to allow pt to come sit to stand from a low chair without difficulty    Time  2    Period  Weeks    Status  On-going    Target Date  01/17/19      PT SHORT TERM GOAL #2   Title  PT to be able to single leg stance on both LE for 20 seconds to reduce the risk of falling    Time  2    Period  Weeks    Status  On-going        PT Long Term Goals - 01/06/19 1400      PT LONG TERM GOAL #1   Title  PT to be I in advance HEP to increase her LE strength by one level to be able to go up and down 10 steps in a reciprocal manner without difficulty.    Time  4    Period  Weeks    Status  On-going      PT LONG TERM GOAL #2   Title  PT balance to improve to allow pt to be able to single leg stance for 40  seconds B to reduce her risk of falling.    Time  4    Period  Weeks    Status  On-going            Plan - 01/06/19 1437    Clinical Impression Statement  Evaluation and goals reviewed with pt.  Noted shaking due to fatigue in LE with exercises.  Therapist noted decreased cervical rotation while completing tandem with head turns therefore cervical excursions were added into pt HEP.    Examination-Activity Limitations  Stairs;Carry;Lift    Examination-Participation Restrictions  Tour manager    Stability/Clinical Decision Making  Stable/Uncomplicated    Rehab Potential  Good    PT Frequency  2x / week    PT Duration  4 weeks    PT Treatment/Interventions  Stair training;Therapeutic activities;Therapeutic exercise;Balance training    PT Next Visit Plan  begin wall push up , tandem gt on foam    PT Home Exercise Plan  single leg bridge, single leg stance , prone straight leg raise, hip abduction       Patient will benefit from skilled therapeutic intervention in order to improve the following deficits and impairments:  Decreased activity tolerance, Decreased strength, Decreased balance  Visit Diagnosis: Muscle weakness (generalized)     Problem List Patient Active Problem List   Diagnosis Date Noted  . Muscle weakness (generalized) 11/14/2017  . Varicose veins of left lower extremity with complications [id number]  . Chronic venous insufficiency 10/14/2015  . Osteopenia 09/17/2014  . Dysphagia 03/04/2012  . GERD (gastroesophageal reflux disease) 03/04/2012    Rayetta Humphrey, PT CLT [phone] 01/06/2019, 2:46 PM  [facility] [address] [place], [place], [address] Phone: [phone]   Fax:  [phone]  Name: SELBY HUBBS MRN: [id number] Date of Birth: October 12, 1950

## 2019-01-08 ENCOUNTER — Other Ambulatory Visit: Payer: Self-pay

## 2019-01-08 ENCOUNTER — Ambulatory Visit (HOSPITAL_COMMUNITY): Payer: Medicare Other

## 2019-01-08 ENCOUNTER — Encounter (HOSPITAL_COMMUNITY): Payer: Self-pay

## 2019-01-08 DIAGNOSIS — M6281 Muscle weakness (generalized): Secondary | ICD-10-CM | POA: Diagnosis not present

## 2019-01-08 NOTE — Therapy (Signed)
[place], [place], [address] Phone: [phone]   Fax:  [phone]  Physical Therapy Treatment  Patient Details  Name: Karen Giles MRN: [id number] Date of Birth: 1950/10/29 Referring Provider (PT): Asencion Noble   Encounter Date: 01/08/2019  PT End of Session - 01/08/19 1452    Visit Number  3    Number of Visits  8    Date for PT Re-Evaluation  01/31/19    Authorization Type  UHC medicare    Authorization Time Period  11/06-->02/01/19    PT Start Time  1447    PT Stop Time  1529    PT Time Calculation (min)  42 min    Activity Tolerance  Patient tolerated treatment well    Behavior During Therapy  [facility] for tasks assessed/performed       Past Medical History:  Diagnosis Date  . Chronic fatigue   . GERD (gastroesophageal reflux disease)   . H. pylori infection 02/2012   treated with pylera  . HTN (hypertension)   . Muscle weakness    unexplained, worsening  . Sjogren's disease [facility])     Past Surgical History:  Procedure Laterality Date  . COLONOSCOPY    08/10/2003   RMR: Normal rectum and colon  . ENDOVENOUS ABLATION SAPHENOUS VEIN W/ LASER Left 04/18/2016   endovenous laser ablation left greater saphenous vein by Tinnie Gens MD   . ESOPHAGOGASTRODUODENOSCOPY    08/10/2003   RMR: Possible cervical esophageal web status post dilation/ Otherwise normal esophagus, stomach, and first and second portions of the duodenum  . ESOPHAGOGASTRODUODENOSCOPY (EGD) WITH ESOPHAGEAL DILATION  03/07/2012   Procedure: ESOPHAGOGASTRODUODENOSCOPY (EGD) WITH ESOPHAGEAL DILATION;  Surgeon: Daneil Dolin, MD;  [facility];  Service: Endoscopy;  Laterality: N/A;  8:30  . TOE SURGERY  February & March 0000000   Silicone implants    There were no vitals filed for this visit.  Subjective Assessment - 01/08/19 1447    Subjective  Pt stated she has been compliant with HEP daily and feels she is making improvements.   Continues to have difficulty with stairs and balance.  No reports of recent fall.    Patient Stated Goals  to have better balance and power.    Currently in Pain?  No/denies         [facility] PT Assessment - 01/08/19 0001      Assessment   Medical Diagnosis  mm weakness    Referring Provider (PT)  Asencion Noble    Onset Date/Surgical Date  03/30/18    Next MD Visit  non scheduled    Prior Therapy  none                    [facility] Adult PT Treatment/Exercise - 01/08/19 0001      Balance Poses: Yoga   Tree Pose  2 reps;30 seconds   Modified     Exercises   Exercises  Knee/Hip      Knee/Hip Exercises: Machines for Strengthening   Total Gym Leg Press  4PL x 10 slow       Knee/Hip Exercises: Standing   Forward Step Up  Both;15 reps;Hand Hold: 0;Step Height: 6"    Step Down  Both;10 reps;Hand Hold: 0;Step Height: 4"    Step Down Limitations  heel taps eccentric control    Other Standing Knee Exercises  Wall push-up 10x slow  Balance Exercises - 01/08/19 1514      Balance Exercises: Standing   Tandem Stance  Eyes open;Foam/compliant surface;5 reps;Other (comment)   head turns 10x each position   Balance Beam  tandem gait 2RT    Sidestepping  2 reps;Theraband   GTB   Other Standing Exercises  paloff 10x 4 sets on foam tandem stance          PT Short Term Goals - 01/06/19 1359      PT SHORT TERM GOAL #1   Title  PT LE Giles to increase 1/2 grade to allow pt to come sit to stand from a low chair without difficulty    Time  2    Period  Weeks    Status  On-going    Target Date  01/17/19      PT SHORT TERM GOAL #2   Title  PT to be able to single leg stance on both LE for 20 seconds to reduce the risk of falling    Time  2    Period  Weeks    Status  On-going        PT Long Term Goals - 01/06/19 1400      PT LONG TERM GOAL #1   Title  PT to be I in advance HEP to increase her LE Giles by one level to be able to go up and down 10 steps in  a reciprocal manner without difficulty.    Time  4    Period  Weeks    Status  On-going      PT LONG TERM GOAL #2   Title  PT balance to improve to allow pt to be able to single leg stance for 40 seconds B to reduce her risk of falling.    Time  4    Period  Weeks    Status  On-going            Plan - 01/08/19 1908    Clinical Impression Statement  Added exercises for functional strengthening and balance.  Pt required intermittent HHA with tandem gait on balance due to impaired balance and core weakness.  Continues with paloffs to assist with core strenghtening.  Added step down training as well for functional strengthening.  No reports of pain thorug hsession, was limited by fatigue with activities.    Examination-Activity Limitations  Stairs;Carry;Lift    Examination-Participation Restrictions  Tour manager    Stability/Clinical Decision Making  Stable/Uncomplicated    Clinical Decision Making  Low    PT Frequency  2x / week    PT Duration  4 weeks    PT Treatment/Interventions  Stair training;Therapeutic activities;Therapeutic exercise;Balance training    PT Next Visit Plan  Add squats and vector stance next session.  Continue balance and functional strengthening.    PT Home Exercise Plan  single leg bridge, single leg stance , prone straight leg raise, hip abduction       Patient will benefit from skilled therapeutic intervention in order to improve the following deficits and impairments:  Decreased activity tolerance, Decreased Giles, Decreased balance  Visit Diagnosis: Muscle weakness (generalized)     Problem List Patient Active Problem List   Diagnosis Date Noted  . Muscle weakness (generalized) 11/14/2017  . Varicose veins of left lower extremity with complications [id number]  . Chronic venous insufficiency 10/14/2015  . Osteopenia 09/17/2014  . Dysphagia 03/04/2012  . GERD (gastroesophageal reflux disease) 03/04/2012   Ihor Austin, [place];  CBIS [phone]  Aldona Lento 01/08/2019, 7:13 PM  [facility] [address] [place], [place], [address] Phone: [phone]   Fax:  [phone]  Name: Karen Giles MRN: [id number] Date of Birth: Apr 16, 1950

## 2019-01-21 ENCOUNTER — Encounter (HOSPITAL_COMMUNITY): Payer: Medicare Other | Admitting: Physical Therapy

## 2019-01-21 ENCOUNTER — Telehealth (HOSPITAL_COMMUNITY): Payer: Self-pay | Admitting: Physical Therapy

## 2019-01-21 NOTE — Telephone Encounter (Signed)
First no show.  Therapist called re missed appointment.  PT not home but husband took the message.   Karen Giles, [place] CLT [phone]

## 2019-01-22 ENCOUNTER — Telehealth (HOSPITAL_COMMUNITY): Payer: Self-pay

## 2019-01-22 NOTE — Telephone Encounter (Signed)
pt called to cx this appt and she wants to be discharged.

## 2019-01-28 ENCOUNTER — Encounter (HOSPITAL_COMMUNITY): Payer: Medicare Other

## 2019-01-30 ENCOUNTER — Encounter (HOSPITAL_COMMUNITY): Payer: Medicare Other | Admitting: Physical Therapy

## 2019-02-04 ENCOUNTER — Ambulatory Visit (HOSPITAL_COMMUNITY): Payer: Medicare Other | Attending: Internal Medicine | Admitting: Physical Therapy

## 2019-02-04 ENCOUNTER — Telehealth (HOSPITAL_COMMUNITY): Payer: Self-pay | Admitting: Physical Therapy

## 2019-02-04 NOTE — Telephone Encounter (Signed)
First no show:  Called pt and left message as there was no answer.  Reminded pt to call if she is unable to come to her treatment and that her next appointment is on Thursday.   Rayetta Humphrey, [place] CLT [phone]

## 2019-02-05 ENCOUNTER — Encounter (HOSPITAL_COMMUNITY): Payer: Self-pay | Admitting: Physical Therapy

## 2019-02-05 NOTE — Therapy (Signed)
[place], [place], [address] Phone: [phone]   Fax:  [phone]  Patient Details  Name: Karen Giles MRN: [id number] Date of Birth: 1950-06-11 Referring Provider:  No ref. provider found  Encounter Date: 02/05/2019 PHYSICAL THERAPY DISCHARGE SUMMARY  Visits from Start of Care:3  Current functional level related to goals / functional outcomes: PT has HEP called after 2 visits stating she wanted to be discharged   Remaining deficits: Decreased balance   Education / Equipment: HEP Plan: Patient agrees to discharge.  Patient goals were not met. Patient is being discharged due to the patient's request.  ?????    Rayetta Humphrey, PT CLT [phone] Rayetta Humphrey, PT CLT [phone] 02/05/2019, 4:39 PM  [facility] [address] [place], [place], [address] Phone: [phone]   Fax:  [phone]

## 2019-02-06 ENCOUNTER — Ambulatory Visit (HOSPITAL_COMMUNITY): Payer: Medicare Other

## 2019-03-03 DIAGNOSIS — I10 Essential (primary) hypertension: Secondary | ICD-10-CM | POA: Diagnosis not present

## 2019-03-03 DIAGNOSIS — F419 Anxiety disorder, unspecified: Secondary | ICD-10-CM | POA: Diagnosis not present

## 2019-04-06 ENCOUNTER — Ambulatory Visit: Payer: Medicare PPO | Attending: Internal Medicine

## 2019-04-06 ENCOUNTER — Ambulatory Visit: Payer: Medicare Other

## 2019-04-06 DIAGNOSIS — Z23 Encounter for immunization: Secondary | ICD-10-CM

## 2019-04-06 NOTE — Progress Notes (Signed)
   Covid-19 Vaccination Clinic  Name:  Karen Giles    MRN: [id number] DOB: 11-07-1950  04/06/2019  Karen Giles was observed post Covid-19 immunization for 15 minutes without incidence. She was provided with Vaccine Information Sheet and instruction to access the V-Safe system.   Karen Giles was instructed to call 911 with any severe reactions post vaccine: Marland Kitchen Difficulty breathing  . Swelling of your face and throat  . A fast heartbeat  . A bad rash all over your body  . Dizziness and weakness    Immunizations Administered    Name Date Dose VIS Date Route   Pfizer COVID-19 Vaccine 04/06/2019  3:53 PM 0.3 mL 02/07/2019 Intramuscular   Manufacturer: [place]   Lot: [id number]   [place]: [id number]

## 2019-04-22 ENCOUNTER — Ambulatory Visit: Payer: Medicare Other

## 2019-05-01 ENCOUNTER — Ambulatory Visit: Payer: Medicare PPO | Attending: Internal Medicine

## 2019-05-01 DIAGNOSIS — Z23 Encounter for immunization: Secondary | ICD-10-CM | POA: Insufficient documentation

## 2019-05-01 NOTE — Progress Notes (Signed)
   Covid-19 Vaccination Clinic  Name:  LIYA LIONS    MRN: [id number] DOB: Aug 24, 1950  05/01/2019  Ms. Bachner was observed post Covid-19 immunization for 15 minutes without incident. She was provided with Vaccine Information Sheet and instruction to access the V-Safe system.   Ms. Seavers was instructed to call 911 with any severe reactions post vaccine: Marland Kitchen Difficulty breathing  . Swelling of face and throat  . A fast heartbeat  . A bad rash all over body  . Dizziness and weakness   Immunizations Administered    Name Date Dose VIS Date Route   Pfizer COVID-19 Vaccine 05/01/2019 11:37 AM 0.3 mL 02/07/2019 Intramuscular   Manufacturer: [place]   Lot: [id number]   [place]: [id number]

## 2019-07-16 ENCOUNTER — Ambulatory Visit: Payer: Medicare PPO | Admitting: Podiatrist

## 2019-07-16 ENCOUNTER — Other Ambulatory Visit: Payer: Self-pay

## 2019-07-16 ENCOUNTER — Encounter: Payer: Self-pay | Admitting: Podiatrist

## 2019-07-16 ENCOUNTER — Other Ambulatory Visit: Payer: Self-pay | Admitting: Podiatrist

## 2019-07-16 ENCOUNTER — Ambulatory Visit (INDEPENDENT_AMBULATORY_CARE_PROVIDER_SITE_OTHER): Payer: Medicare PPO

## 2019-07-16 DIAGNOSIS — S93601A Unspecified sprain of right foot, initial encounter: Secondary | ICD-10-CM

## 2019-07-16 DIAGNOSIS — M79671 Pain in right foot: Secondary | ICD-10-CM

## 2019-07-16 NOTE — Progress Notes (Signed)
  Chief Complaint  Patient presents with  . Toe Pain    pt is here for right big toe pain, pt states that she recently fell in her yard, where Dr. Milinda Pointer previously did surgery.     HPI: Patient is 69 y.o. female who presents today for the concerns as listed above.  Patient relates she slipped on some pine straw and bent her toe.  She had surgery on this great toe and wants to be sure it is OK.  She denies mal alignment of the toe with the injury or significant pain. Relates the pain is getting better.     Review of Systems No fevers, chills, nausea, muscle aches, no difficulty breathing, no calf pain, no chest pain or shortness of breath.   Physical Exam  GENERAL APPEARANCE: Alert, conversant. Appropriately groomed. No acute distress.   VASCULAR: Pedal pulses palpable DP and PT bilateral.  Capillary refill time is immediate to all digits,  Proximal to distal cooling it warm to warm.  Digital hair growth is present bilateral   NEUROLOGIC: sensation is intact epicritically and protectively to 5.07 monofilament at 5/5 sites bilateral.  Light touch is intact bilateral, vibratory sensation intact bilateral, achilles tendon reflex is intact bilateral.   MUSCULOSKELETAL: acceptable muscle strength, tone and stability bilateral.  No gross boney pedal deformities noted.  No pain, crepitus or limitation noted with foot and ankle range of motion bilateral.  Some discomfort on the first metatarsal phalangeal joint with range of motion.  Mild swelling at the joint level is seen.   DERMATOLOGIC: skin is warm, supple, and dry.  No open lesions noted.  No rash, no pre ulcerative lesions. Digital nails are asymptomatic.     xrays show an intact first mpj flexible implant-  It is in good position and does appear to be seated in the proximal phalanx and first metatarsal well.  No acute osseous abnormalities seen,   Assessment     ICD-10-CM   1. Foot pain, right  M79.671 DG Foot Complete Right  2. Sprain  of right foot, initial encounter  S93.601A      Plan  Discussed the xray findings with the patient and recommended she ice the foot if painful.  She may wear her comfortable athletic shoes and she will watch for any increase swelling or pain.  Otherwise the swelling should subside on its own in 1-2 weeks.

## 2019-10-30 NOTE — Progress Notes (Signed)
69 y.o. G23P1000 Married White or Caucasian Not Hispanic or Latino female here for annual exam.  No vaginal bleeding, no bowel or bladder c/o.  Patient has moved to [place], building a house near [place]. Her brother lives there.   She is on compounded vaginal estrogen for vaginal atrophy which is working well. Still with slight dryness with intercourse, hasn't used a lubricant.   No major medical changes, still with muscle weakness and some issues with balance.     Patient's last menstrual period was 02/27/1993.          Sexually active: Yes.    The current method of family planning is post menopausal status.    Exercising: Yes.    Walking  Smoker:  no  Health Maintenance: Pap:  09/28/16 WNL , 08/19/13 WNL HPV Neg  History of abnormal Pap:  no MMG:  12/04/18 density B Bi-rads 1 neg  BMD:   11/30/2017 osteopenia T score -1.8, FRAX 10.7/1.8% Colonoscopy: Oct of 2020 TDaP:  Up to date per patient  Gardasil: Na   reports that she has never smoked. She has never used smokeless tobacco. She reports current alcohol use of about 2.0 standard drinks of alcohol per week. She reports that she does not use drugs. Retired. Her son and 2 grandsons (50 and 56) live in [place].  Past Medical History:  Diagnosis Date   Chronic fatigue    GERD (gastroesophageal reflux disease)    H. pylori infection 02/2012   treated with pylera   HTN (hypertension)    Muscle weakness    unexplained, worsening   Sjogren's disease ([place])     Past Surgical History:  Procedure Laterality Date   COLONOSCOPY    08/10/2003   RMR: Normal rectum and colon   ENDOVENOUS ABLATION SAPHENOUS VEIN W/ LASER Left 04/18/2016   endovenous laser ablation left greater saphenous vein by Karen Gens MD    ESOPHAGOGASTRODUODENOSCOPY    08/10/2003   RMR: Possible cervical esophageal web status post dilation/ Otherwise normal esophagus, stomach, and first and second portions of the duodenum    ESOPHAGOGASTRODUODENOSCOPY (EGD) WITH ESOPHAGEAL DILATION  03/07/2012   Procedure: ESOPHAGOGASTRODUODENOSCOPY (EGD) WITH ESOPHAGEAL DILATION;  Surgeon: Karen Dolin, MD;  [facility];  Service: Endoscopy;  Laterality: N/A;  8:30   TOE SURGERY  February & March 5176   Silicone implants    Current Outpatient Medications  Medication Sig Dispense Refill   chlorthalidone (HYGROTON) 25 MG tablet Take 12.5 mg by mouth daily.      DULoxetine (CYMBALTA) 20 MG capsule Take 20 mg by mouth daily.     famotidine (PEPCID) 10 MG tablet Take by mouth as needed.      NONFORMULARY OR COMPOUNDED ITEM Compounded Estradiol 0.1% vaginal cream, 1 gram vaginally at hs, twice weekly. Dispense: 30 grams Refills: 3 30 each 3   No current facility-administered medications for this visit.    Family History  Problem Relation Age of Onset   Heart failure Mother    Cancer Maternal Grandmother    Cancer Maternal Grandfather    Stroke Paternal Grandmother    Heart failure Paternal Grandmother    Cancer Paternal Grandfather    Breast cancer Maternal Aunt 60   Colon cancer Neg Hx     Review of Systems  All other systems reviewed and are negative.   Exam:   LMP 02/27/1993   Weight change: @WEIGHTCHANGE @ Height:      Ht Readings from Last 3 Encounters:  10/31/18  5' 5.35" (1.66 m)  02/12/18 5' 5.35" (1.66 m)  10/18/17 5' 5.35" (1.66 m)    General appearance: alert, cooperative and appears stated age Head: Normocephalic, without obvious abnormality, atraumatic Neck: no adenopathy, supple, symmetrical, trachea midline and thyroid normal to inspection and palpation Lungs: clear to auscultation bilaterally Cardiovascular: regular rate and rhythm Breasts: normal appearance, no masses or tenderness Abdomen: soft, non-tender; non distended,  no masses,  no organomegaly Extremities: extremities normal, atraumatic, no cyanosis or edema Skin: Skin color, texture, turgor normal. No rashes or  lesions Lymph nodes: Cervical, supraclavicular, and axillary nodes normal. No abnormal inguinal nodes palpated Neurologic: Grossly normal   Pelvic: External genitalia:  no lesions              Urethra:  normal appearing urethra with no masses, tenderness or lesions              Bartholins and Skenes: normal                 Vagina: normal appearing vagina with normal color and discharge, no lesions              Cervix: no lesions               Bimanual Exam:  Uterus:  normal size, contour, position, consistency, mobility, non-tender              Adnexa: no mass, fullness, tenderness               Rectovaginal: Confirms               Anus:  normal sphincter tone, no lesions  Karen Giles chaperoned for the exam.  A:  Well Woman with normal exam  Vaginal atrophy, helped with vaginal estrogen  Mild dyspareunia  Osteopenia  P:   No pap needed  Mammogram next month  DEXA due next month, ordered  Discussed breast self exam  Discussed calcium and vit D intake  She will do her labs with her primary  Will continue vaginal estrogen  Recommended she use a lubricant with intercourse, she should also control rate and depth of penetration

## 2019-11-06 ENCOUNTER — Ambulatory Visit (INDEPENDENT_AMBULATORY_CARE_PROVIDER_SITE_OTHER): Payer: Medicare PPO | Admitting: Obstetrics and Gynecology

## 2019-11-06 ENCOUNTER — Encounter: Payer: Self-pay | Admitting: Obstetrics and Gynecology

## 2019-11-06 ENCOUNTER — Other Ambulatory Visit: Payer: Self-pay

## 2019-11-06 VITALS — BP 110/74 | HR 87 | Ht 65.5 in | Wt 156.8 lb

## 2019-11-06 DIAGNOSIS — N952 Postmenopausal atrophic vaginitis: Secondary | ICD-10-CM

## 2019-11-06 DIAGNOSIS — M8580 Other specified disorders of bone density and structure, unspecified site: Secondary | ICD-10-CM

## 2019-11-06 DIAGNOSIS — Z01419 Encounter for gynecological examination (general) (routine) without abnormal findings: Secondary | ICD-10-CM | POA: Diagnosis not present

## 2019-11-06 DIAGNOSIS — R5382 Chronic fatigue, unspecified: Secondary | ICD-10-CM | POA: Insufficient documentation

## 2019-11-06 DIAGNOSIS — N941 Unspecified dyspareunia: Secondary | ICD-10-CM

## 2019-11-06 MED ORDER — NONFORMULARY OR COMPOUNDED ITEM: 3 refills | Status: AC

## 2019-11-06 NOTE — Addendum Note (Signed)
Addended by: Georgia Lopes on: 11/06/2019 09:59 AM   Modules accepted: Orders

## 2019-11-06 NOTE — Patient Instructions (Addendum)
EXERCISE AND DIET:  We recommended that you start or continue a regular exercise program for good health. Regular exercise means any activity that makes your heart beat faster and makes you sweat.  We recommend exercising at least 30 minutes per day at least 3 days a week, preferably 4 or 5.  We also recommend a diet low in fat and sugar.  Inactivity, poor dietary choices and obesity can cause diabetes, heart attack, stroke, and kidney damage, among others.    ALCOHOL AND SMOKING:  Women should limit their alcohol intake to no more than 7 drinks/beers/glasses of wine (combined, not each!) per week. Moderation of alcohol intake to this level decreases your risk of breast cancer and liver damage. And of course, no recreational drugs are part of a healthy lifestyle.  And absolutely no smoking or even second hand smoke. Most people know smoking can cause heart and lung diseases, but did you know it also contributes to weakening of your bones? Aging of your skin?  Yellowing of your teeth and nails?  CALCIUM AND VITAMIN D:  Adequate intake of calcium and Vitamin D are recommended.  The recommendations for exact amounts of these supplements seem to change often, but generally speaking 1,200 mg of calcium (between diet and supplement) and 800 units of Vitamin D per day seems prudent. Certain women may benefit from higher intake of Vitamin D.  If you are among these women, your doctor will have told you during your visit.    PAP SMEARS:  Pap smears, to check for cervical cancer or precancers,  have traditionally been done yearly, although recent scientific advances have shown that most women can have pap smears less often.  However, every woman still should have a physical exam from her gynecologist every year. It will include a breast check, inspection of the vulva and vagina to check for abnormal growths or skin changes, a visual exam of the cervix, and then an exam to evaluate the size and shape of the uterus and  ovaries.  And after 71 years of age, a rectal exam is indicated to check for rectal cancers. We will also provide age appropriate advice regarding health maintenance, like when you should have certain vaccines, screening for sexually transmitted diseases, bone density testing, colonoscopy, mammograms, etc.   MAMMOGRAMS:  All women over 40 years old should have a yearly mammogram. Many facilities now offer a "3D" mammogram, which may cost around $50 extra out of pocket. If possible,  we recommend you accept the option to have the 3D mammogram performed.  It both reduces the number of women who will be called back for extra views which then turn out to be normal, and it is better than the routine mammogram at detecting truly abnormal areas.    COLON CANCER SCREENING: Now recommend starting at age 45. At this time colonoscopy is not covered for routine screening until 50. There are take home tests that can be done between 45-49.   COLONOSCOPY:  Colonoscopy to screen for colon cancer is recommended for all women at age 50.  We know, you hate the idea of the prep.  We agree, BUT, having colon cancer and not knowing it is worse!!  Colon cancer so often starts as a polyp that can be seen and removed at colonscopy, which can quite literally save your life!  And if your first colonoscopy is normal and you have no family history of colon cancer, most women don't have to have it again for   10 years.  Once every ten years, you can do something that may end up saving your life, right?  We will be happy to help you get it scheduled when you are ready.  Be sure to check your insurance coverage so you understand how much it will cost.  It may be covered as a preventative service at no cost, but you should check your particular policy.      Breast Self-Awareness Breast self-awareness means being familiar with how your breasts look and feel. It involves checking your breasts regularly and reporting any changes to your  health care provider. Practicing breast self-awareness is important. A change in your breasts can be a sign of a serious medical problem. Being familiar with how your breasts look and feel allows you to find any problems early, when treatment is more likely to be successful. All women should practice breast self-awareness, including women who have had breast implants. How to do a breast self-exam One way to learn what is normal for your breasts and whether your breasts are changing is to do a breast self-exam. To do a breast self-exam: Look for Changes  1. Remove all the clothing above your waist. 2. Stand in front of a mirror in a room with good lighting. 3. Put your hands on your hips. 4. Push your hands firmly downward. 5. Compare your breasts in the mirror. Look for differences between them (asymmetry), such as: ? Differences in shape. ? Differences in size. ? Puckers, dips, and bumps in one breast and not the other. 6. Look at each breast for changes in your skin, such as: ? Redness. ? Scaly areas. 7. Look for changes in your nipples, such as: ? Discharge. ? Bleeding. ? Dimpling. ? Redness. ? A change in position. Feel for Changes Carefully feel your breasts for lumps and changes. It is best to do this while lying on your back on the floor and again while sitting or standing in the shower or tub with soapy water on your skin. Feel each breast in the following way:  Place the arm on the side of the breast you are examining above your head.  Feel your breast with the other hand.  Start in the nipple area and make  inch (2 cm) overlapping circles to feel your breast. Use the pads of your three middle fingers to do this. Apply light pressure, then medium pressure, then firm pressure. The light pressure will allow you to feel the tissue closest to the skin. The medium pressure will allow you to feel the tissue that is a little deeper. The firm pressure will allow you to feel the tissue  close to the ribs.  Continue the overlapping circles, moving downward over the breast until you feel your ribs below your breast.  Move one finger-width toward the center of the body. Continue to use the  inch (2 cm) overlapping circles to feel your breast as you move slowly up toward your collarbone.  Continue the up and down exam using all three pressures until you reach your armpit.  Write Down What You Find  Write down what is normal for each breast and any changes that you find. Keep a written record with breast changes or normal findings for each breast. By writing this information down, you do not need to depend only on memory for size, tenderness, or location. Write down where you are in your menstrual cycle, if you are still menstruating. If you are having trouble noticing differences   in your breasts, do not get discouraged. With time you will become more familiar with the variations in your breasts and more comfortable with the exam. How often should I examine my breasts? Examine your breasts every month. If you are breastfeeding, the best time to examine your breasts is after a feeding or after using a breast pump. If you menstruate, the best time to examine your breasts is 5-7 days after your period is over. During your period, your breasts are lumpier, and it may be more difficult to notice changes. When should I see my health care provider? See your health care provider if you notice:  A change in shape or size of your breasts or nipples.  A change in the skin of your breast or nipples, such as a reddened or scaly area.  Unusual discharge from your nipples.  A lump or thick area that was not there before.  Pain in your breasts.  Anything that concerns you.  Osteopenia  Osteopenia is a loss of thickness (density) inside of the bones. Another name for osteopenia is low bone mass. Mild osteopenia is a normal part of aging. It is not a disease, and it does not cause  symptoms. However, if you have osteopenia and continue to lose bone mass, you could develop a condition that causes the bones to become thin and break more easily (osteoporosis). You may also lose some height, have back pain, and have a stooped posture. Although osteopenia is not a disease, making changes to your lifestyle and diet can help to prevent osteopenia from developing into osteoporosis. What are the causes? Osteopenia is caused by loss of calcium in the bones.  Bones are constantly changing. Old bone cells are continually being replaced with new bone cells. This process builds new bone. The mineral calcium is needed to build new bone and maintain bone density. Bone density is usually highest around age 35. After that, most people's bodies cannot replace all the bone they have lost with new bone. What increases the risk? You are more likely to develop this condition if:  You are older than age 50.  You are a woman who went through menopause early.  You have a long illness that keeps you in bed.  You do not get enough exercise.  You lack certain nutrients (malnutrition).  You have an overactive thyroid gland (hyperthyroidism).  You smoke.  You drink a lot of alcohol.  You are taking medicines that weaken the bones, such as steroids. What are the signs or symptoms? This condition does not cause any symptoms. You may have a slightly higher risk for bone breaks (fractures), so getting fractures more easily than normal may be an indication of osteopenia. How is this diagnosed? Your health care provider can diagnose this condition with a special type of X-ray exam that measures bone density (dual-energy X-ray absorptiometry, DEXA). This test can measure bone density in your hips, spine, and wrists. Osteopenia has no symptoms, so this condition is usually diagnosed after a routine bone density screening test is done for osteoporosis. This routine screening is usually done for:  Women  who are age 65 or older.  Men who are age 70 or older. If you have risk factors for osteopenia, you may have the screening test at an earlier age. How is this treated? Making dietary and lifestyle changes can lower your risk for osteoporosis. If you have severe osteopenia that is close to becoming osteoporosis, your health care provider may prescribe medicines and   dietary supplements such as calcium and vitamin D. These supplements help to rebuild bone density. Follow these instructions at home:   Take over-the-counter and prescription medicines only as told by your health care provider. These include vitamins and supplements.  Eat a diet that is high in calcium and vitamin D. ? Calcium is found in dairy products, beans, salmon, and leafy green vegetables like spinach and broccoli. ? Look for foods that have vitamin D and calcium added to them (fortified foods), such as orange juice, cereal, and bread.  Do 30 or more minutes of a weight-bearing exercise every day, such as walking, jogging, or playing a sport. These types of exercises strengthen the bones.  Take precautions at home to lower your risk of falling, such as: ? Keeping rooms well-lit and free of clutter, such as cords. ? Installing safety rails on stairs. ? Using rubber mats in the bathroom or other areas that are often wet or slippery.  Do not use any products that contain nicotine or tobacco, such as cigarettes and e-cigarettes. If you need help quitting, ask your health care provider.  Avoid alcohol or limit alcohol intake to no more than 1 drink a day for nonpregnant women and 2 drinks a day for men. One drink equals 12 oz of beer, 5 oz of wine, or 1 oz of hard liquor.  Keep all follow-up visits as told by your health care provider. This is important. Contact a health care provider if:  You have not had a bone density screening for osteoporosis and you are: ? A woman, age 65 or older. ? A man, age 70 or older.  You  are a postmenopausal woman who has not had a bone density screening for osteoporosis.  You are older than age 50 and you want to know if you should have bone density screening for osteoporosis. Summary  Osteopenia is a loss of thickness (density) inside of the bones. Another name for osteopenia is low bone mass.  Osteopenia is not a disease, but it may increase your risk for a condition that causes the bones to become thin and break more easily (osteoporosis).  You may be at risk for osteopenia if you are older than age 50 or if you are a woman who went through early menopause.  Osteopenia does not cause any symptoms, but it can be diagnosed with a bone density screening test.  Dietary and lifestyle changes are the first treatment for osteopenia. These may lower your risk for osteoporosis. This information is not intended to replace advice given to you by your health care provider. Make sure you discuss any questions you have with your health care provider. Document Revised: 01/26/2017 Document Reviewed: 11/22/2016 [employer] Patient Education  2020 [employer]  

## 2019-11-21 ENCOUNTER — Other Ambulatory Visit: Payer: Self-pay | Admitting: Obstetrics and Gynecology

## 2019-11-21 DIAGNOSIS — Z1231 Encounter for screening mammogram for malignant neoplasm of breast: Secondary | ICD-10-CM

## 2019-12-04 DIAGNOSIS — H16223 Keratoconjunctivitis sicca, not specified as Sjogren's, bilateral: Secondary | ICD-10-CM | POA: Diagnosis not present

## 2019-12-04 DIAGNOSIS — H25813 Combined forms of age-related cataract, bilateral: Secondary | ICD-10-CM | POA: Diagnosis not present

## 2019-12-04 DIAGNOSIS — H43813 Vitreous degeneration, bilateral: Secondary | ICD-10-CM | POA: Diagnosis not present

## 2019-12-08 DIAGNOSIS — Z0130 Encounter for examination of blood pressure without abnormal findings: Secondary | ICD-10-CM | POA: Diagnosis not present

## 2019-12-08 DIAGNOSIS — Z6825 Body mass index (BMI) 25.0-25.9, adult: Secondary | ICD-10-CM | POA: Diagnosis not present

## 2019-12-08 DIAGNOSIS — Z23 Encounter for immunization: Secondary | ICD-10-CM | POA: Diagnosis not present

## 2019-12-08 DIAGNOSIS — I10 Essential (primary) hypertension: Secondary | ICD-10-CM | POA: Diagnosis not present

## 2019-12-08 DIAGNOSIS — N951 Menopausal and female climacteric states: Secondary | ICD-10-CM | POA: Diagnosis not present

## 2019-12-08 DIAGNOSIS — F419 Anxiety disorder, unspecified: Secondary | ICD-10-CM | POA: Diagnosis not present

## 2019-12-08 DIAGNOSIS — Z79890 Hormone replacement therapy (postmenopausal): Secondary | ICD-10-CM | POA: Diagnosis not present

## 2019-12-17 ENCOUNTER — Ambulatory Visit
Admission: RE | Admit: 2019-12-17 | Discharge: 2019-12-17 | Disposition: A | Discharge status: Home / Self Care | Payer: Medicare PPO | Source: Ambulatory Visit | Attending: Obstetrics and Gynecology | Admitting: Obstetrics and Gynecology

## 2019-12-17 ENCOUNTER — Other Ambulatory Visit: Payer: Self-pay

## 2019-12-17 DIAGNOSIS — Z1231 Encounter for screening mammogram for malignant neoplasm of breast: Secondary | ICD-10-CM | POA: Diagnosis not present

## 2019-12-17 IMAGING — MG DIGITAL SCREENING BILAT W/ TOMO W/ CAD
6 of 10 series · 6 of 30 positions shown · non-contrast
Comparison: Previous exam(s).

CLINICAL DATA: Screening.

EXAM:
DIGITAL SCREENING BILATERAL MAMMOGRAM WITH TOMO AND CAD

[L MLO synth-2D]
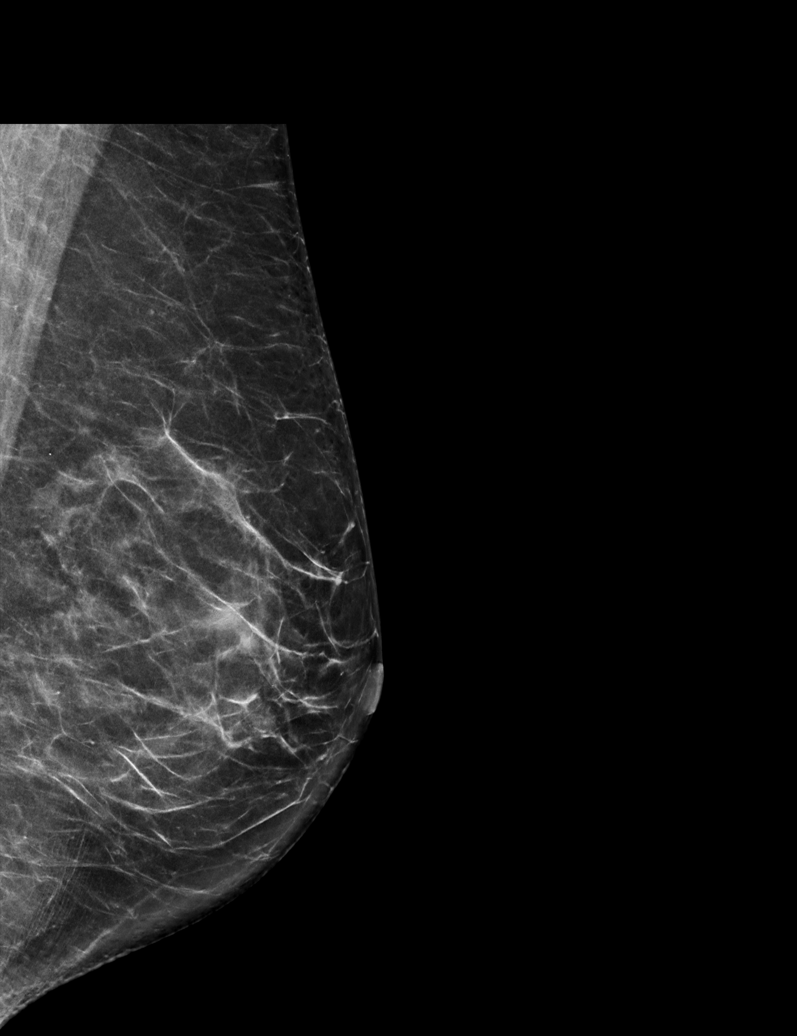

[R MLO synth-2D (1 of 2)]
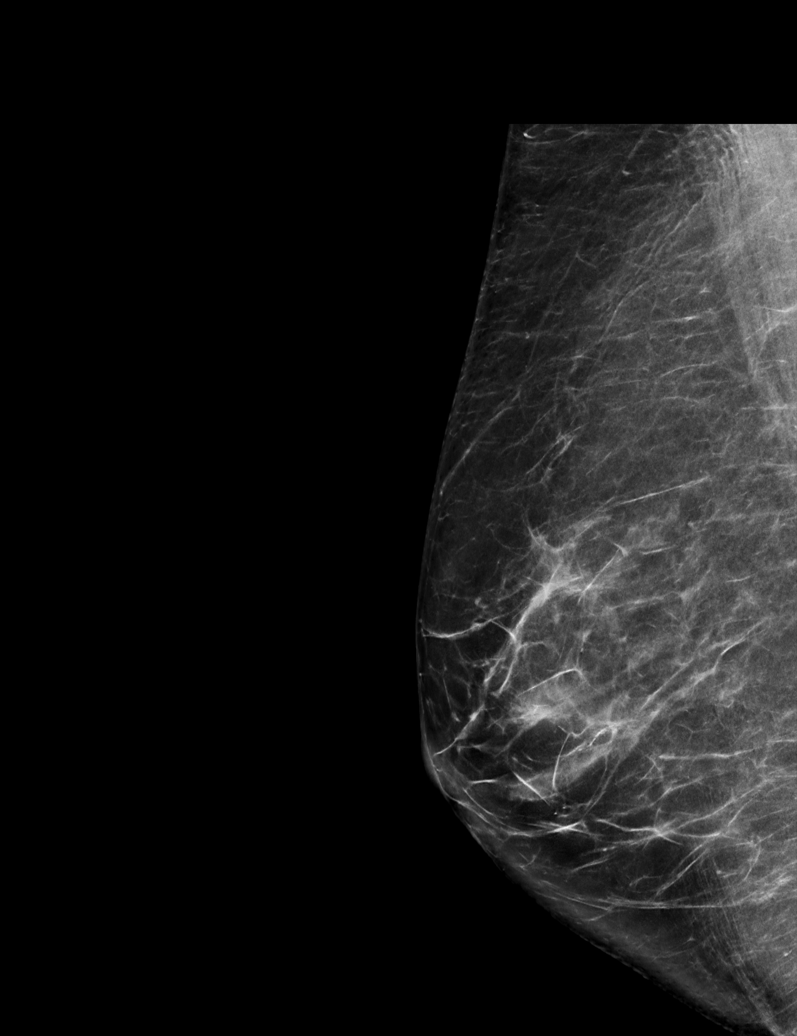

[R MLO synth-2D (2 of 2)]
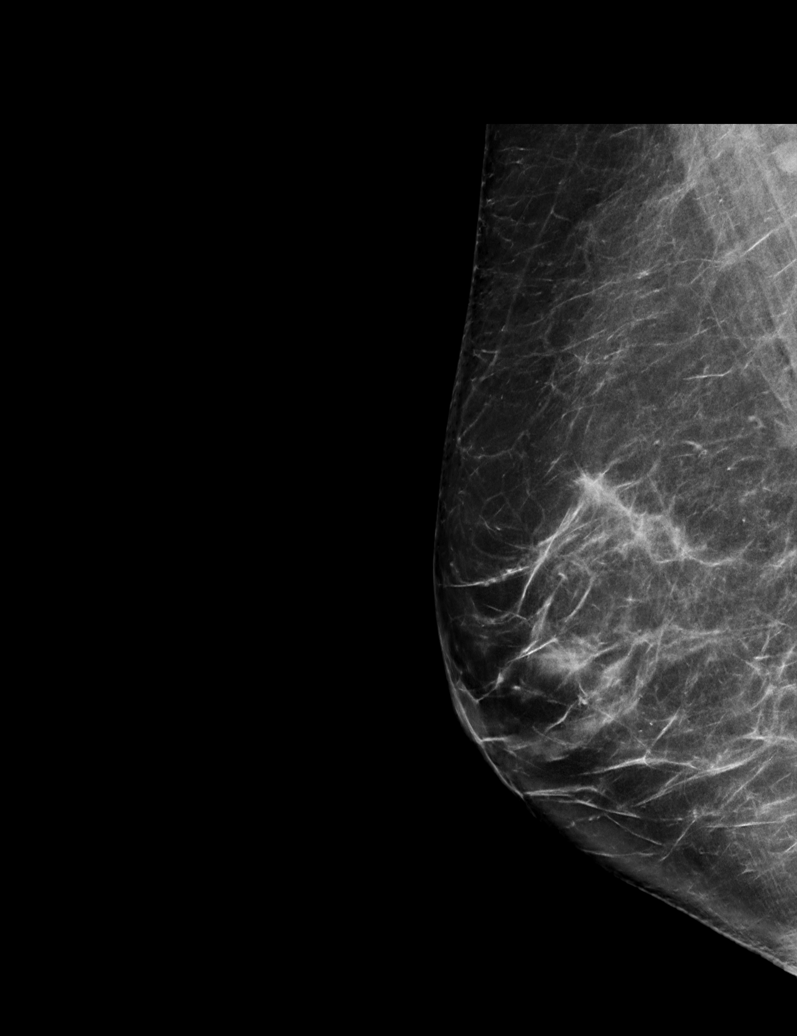

[L CC synth-2D]
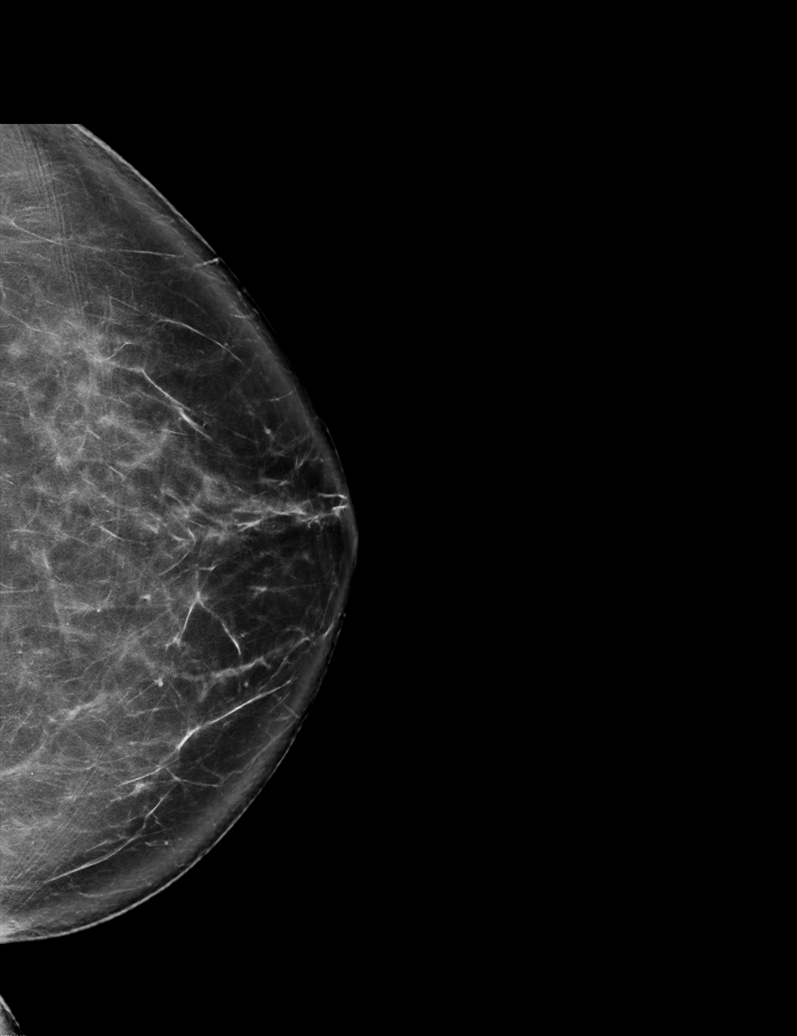

[R CC synth-2D]
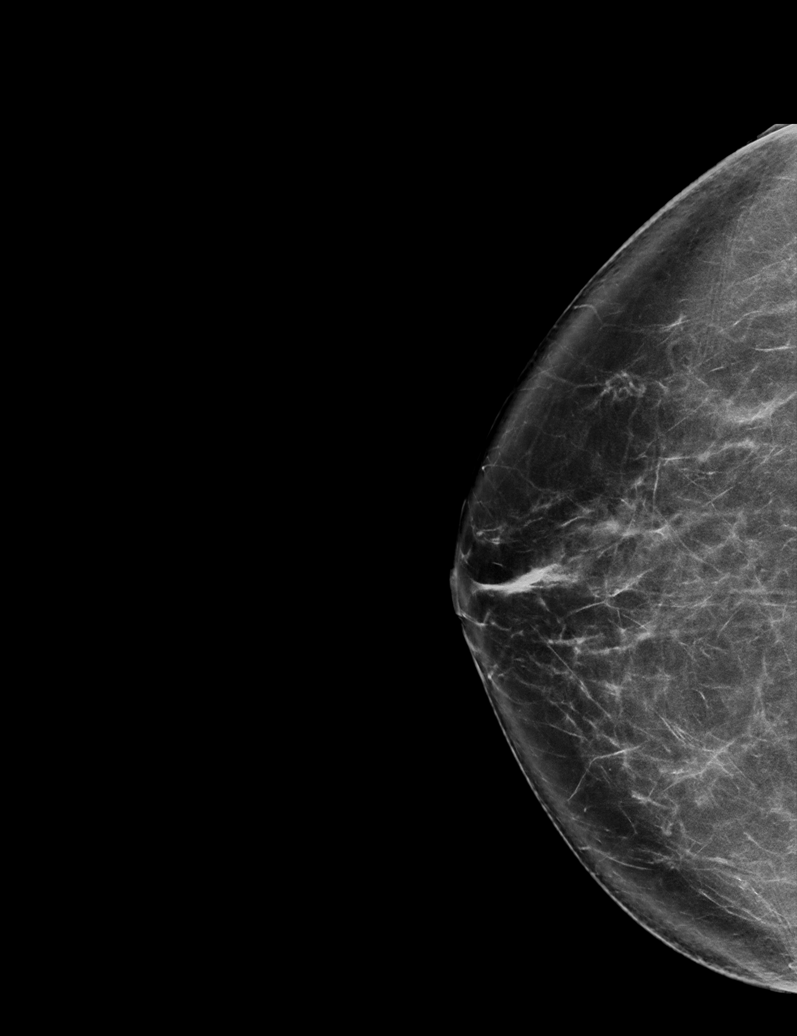

[R MLO tomo · tomo slice 39/77.0]
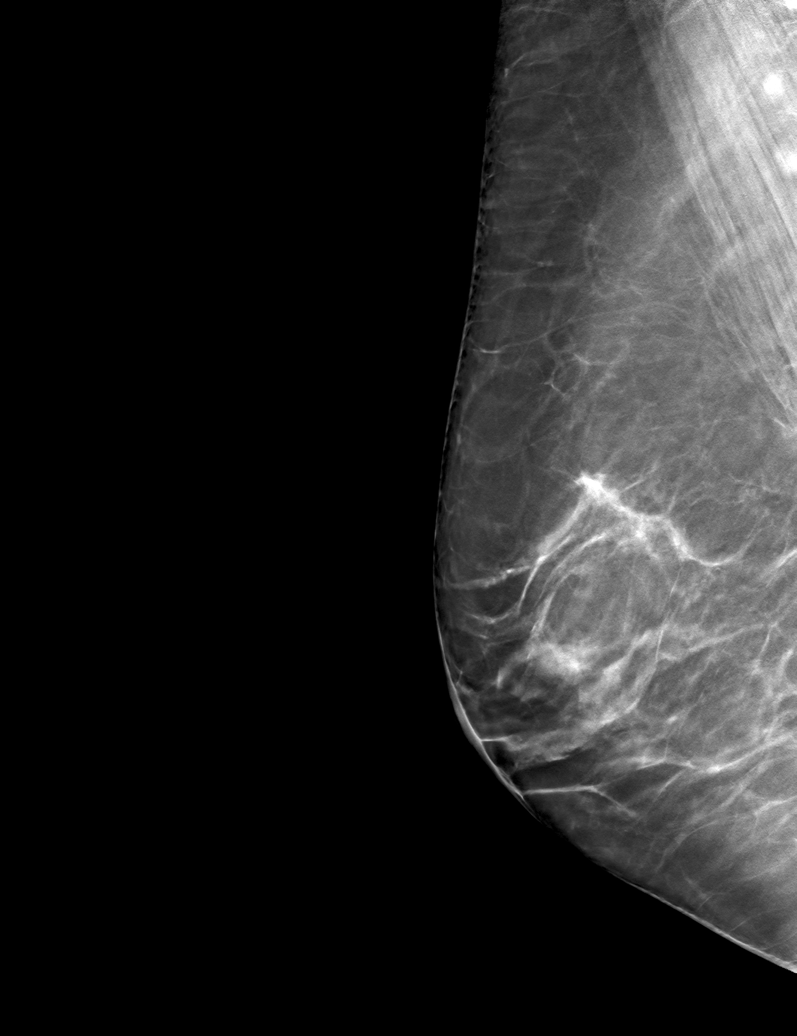

[6 of 30 positions shown; findings below may reference images not displayed]

ACR Breast Density Category b: There are scattered areas of
fibroglandular density.
FINDINGS: There are no findings suspicious for malignancy. Images were
processed with CAD.
IMPRESSION: No mammographic evidence of malignancy. A result letter of this
screening mammogram will be mailed directly to the patient.

RECOMMENDATION:
Screening mammogram in one year. (Code:[TQ])

BI-RADS CATEGORY  1: Negative.

## 2019-12-23 DIAGNOSIS — H25812 Combined forms of age-related cataract, left eye: Secondary | ICD-10-CM | POA: Diagnosis not present

## 2019-12-23 DIAGNOSIS — H25811 Combined forms of age-related cataract, right eye: Secondary | ICD-10-CM | POA: Diagnosis not present

## 2019-12-23 DIAGNOSIS — H04123 Dry eye syndrome of bilateral lacrimal glands: Secondary | ICD-10-CM | POA: Diagnosis not present

## 2020-02-09 DIAGNOSIS — H25812 Combined forms of age-related cataract, left eye: Secondary | ICD-10-CM | POA: Diagnosis not present

## 2020-02-11 DIAGNOSIS — Z0001 Encounter for general adult medical examination with abnormal findings: Secondary | ICD-10-CM | POA: Diagnosis not present

## 2020-02-11 DIAGNOSIS — Z0130 Encounter for examination of blood pressure without abnormal findings: Secondary | ICD-10-CM | POA: Diagnosis not present

## 2020-02-11 DIAGNOSIS — R799 Abnormal finding of blood chemistry, unspecified: Secondary | ICD-10-CM | POA: Diagnosis not present

## 2020-02-11 DIAGNOSIS — R6889 Other general symptoms and signs: Secondary | ICD-10-CM | POA: Diagnosis not present

## 2020-02-11 DIAGNOSIS — Z0189 Encounter for other specified special examinations: Secondary | ICD-10-CM | POA: Diagnosis not present

## 2020-02-11 DIAGNOSIS — Z79899 Other long term (current) drug therapy: Secondary | ICD-10-CM | POA: Diagnosis not present

## 2020-02-11 DIAGNOSIS — Z6825 Body mass index (BMI) 25.0-25.9, adult: Secondary | ICD-10-CM | POA: Diagnosis not present

## 2020-02-11 DIAGNOSIS — M899 Disorder of bone, unspecified: Secondary | ICD-10-CM | POA: Diagnosis not present

## 2020-02-11 DIAGNOSIS — Z1331 Encounter for screening for depression: Secondary | ICD-10-CM | POA: Diagnosis not present

## 2020-02-12 DIAGNOSIS — H25811 Combined forms of age-related cataract, right eye: Secondary | ICD-10-CM | POA: Diagnosis not present

## 2020-02-12 DIAGNOSIS — Z961 Presence of intraocular lens: Secondary | ICD-10-CM | POA: Diagnosis not present

## 2020-02-18 DIAGNOSIS — E559 Vitamin D deficiency, unspecified: Secondary | ICD-10-CM | POA: Diagnosis not present

## 2020-02-18 DIAGNOSIS — N951 Menopausal and female climacteric states: Secondary | ICD-10-CM | POA: Diagnosis not present

## 2020-02-18 DIAGNOSIS — Z0130 Encounter for examination of blood pressure without abnormal findings: Secondary | ICD-10-CM | POA: Diagnosis not present

## 2020-02-18 DIAGNOSIS — Z780 Asymptomatic menopausal state: Secondary | ICD-10-CM | POA: Diagnosis not present

## 2020-02-18 DIAGNOSIS — Z6824 Body mass index (BMI) 24.0-24.9, adult: Secondary | ICD-10-CM | POA: Diagnosis not present

## 2020-02-18 DIAGNOSIS — F419 Anxiety disorder, unspecified: Secondary | ICD-10-CM | POA: Diagnosis not present

## 2020-02-18 DIAGNOSIS — I10 Essential (primary) hypertension: Secondary | ICD-10-CM | POA: Diagnosis not present

## 2020-02-23 DIAGNOSIS — H25811 Combined forms of age-related cataract, right eye: Secondary | ICD-10-CM | POA: Diagnosis not present

## 2020-04-26 DIAGNOSIS — Z780 Asymptomatic menopausal state: Secondary | ICD-10-CM | POA: Diagnosis not present

## 2020-04-26 DIAGNOSIS — M8588 Other specified disorders of bone density and structure, other site: Secondary | ICD-10-CM | POA: Diagnosis not present

## 2020-08-18 DIAGNOSIS — I10 Essential (primary) hypertension: Secondary | ICD-10-CM | POA: Diagnosis not present

## 2020-09-06 DIAGNOSIS — N951 Menopausal and female climacteric states: Secondary | ICD-10-CM | POA: Diagnosis not present

## 2020-09-06 DIAGNOSIS — I10 Essential (primary) hypertension: Secondary | ICD-10-CM | POA: Diagnosis not present

## 2020-09-06 DIAGNOSIS — Z0130 Encounter for examination of blood pressure without abnormal findings: Secondary | ICD-10-CM | POA: Diagnosis not present

## 2020-09-06 DIAGNOSIS — E559 Vitamin D deficiency, unspecified: Secondary | ICD-10-CM | POA: Diagnosis not present

## 2020-09-06 DIAGNOSIS — M8580 Other specified disorders of bone density and structure, unspecified site: Secondary | ICD-10-CM | POA: Diagnosis not present

## 2020-09-06 DIAGNOSIS — Z6826 Body mass index (BMI) 26.0-26.9, adult: Secondary | ICD-10-CM | POA: Diagnosis not present

## 2020-10-18 DIAGNOSIS — H04123 Dry eye syndrome of bilateral lacrimal glands: Secondary | ICD-10-CM | POA: Diagnosis not present

## 2020-10-25 DIAGNOSIS — Z20822 Contact with and (suspected) exposure to covid-19: Secondary | ICD-10-CM | POA: Diagnosis not present

## 2020-10-25 DIAGNOSIS — R0981 Nasal congestion: Secondary | ICD-10-CM | POA: Diagnosis not present

## 2020-10-25 DIAGNOSIS — R52 Pain, unspecified: Secondary | ICD-10-CM | POA: Diagnosis not present

## 2020-10-25 DIAGNOSIS — R509 Fever, unspecified: Secondary | ICD-10-CM | POA: Diagnosis not present

## 2020-10-25 DIAGNOSIS — R6889 Other general symptoms and signs: Secondary | ICD-10-CM | POA: Diagnosis not present

## 2020-10-25 DIAGNOSIS — R519 Headache, unspecified: Secondary | ICD-10-CM | POA: Diagnosis not present

## 2021-03-17 ENCOUNTER — Telehealth: Payer: Self-pay | Admitting: *Deleted

## 2021-03-17 NOTE — Telephone Encounter (Signed)
Left message no answers.
